# Patient Record
Sex: Female | Born: 1960 | Race: Black or African American | Hispanic: No | Marital: Married | Smoking: Never smoker
Health system: Southern US, Community
[De-identification: ages and names within clinical notes are randomized; demographics above are authoritative.]

## PROBLEM LIST (undated history)

## (undated) DIAGNOSIS — G629 Polyneuropathy, unspecified: Secondary | ICD-10-CM

## (undated) DIAGNOSIS — I10 Essential (primary) hypertension: Secondary | ICD-10-CM

## (undated) DIAGNOSIS — I82409 Acute embolism and thrombosis of unspecified deep veins of unspecified lower extremity: Secondary | ICD-10-CM

## (undated) DIAGNOSIS — E119 Type 2 diabetes mellitus without complications: Secondary | ICD-10-CM

## (undated) DIAGNOSIS — B1920 Unspecified viral hepatitis C without hepatic coma: Secondary | ICD-10-CM

## (undated) DIAGNOSIS — M1990 Unspecified osteoarthritis, unspecified site: Secondary | ICD-10-CM

## (undated) DIAGNOSIS — G43909 Migraine, unspecified, not intractable, without status migrainosus: Secondary | ICD-10-CM

## (undated) HISTORY — PX: ABDOMINAL HYSTERECTOMY: SHX81

## (undated) HISTORY — DX: Migraine, unspecified, not intractable, without status migrainosus: G43.909

## (undated) HISTORY — DX: Acute embolism and thrombosis of unspecified deep veins of unspecified lower extremity: I82.409

---

## 2005-01-13 ENCOUNTER — Emergency Department: Payer: Self-pay | Admitting: Emergency Medicine

## 2005-06-11 ENCOUNTER — Ambulatory Visit: Payer: Self-pay

## 2005-08-11 ENCOUNTER — Emergency Department: Payer: Self-pay | Admitting: Emergency Medicine

## 2006-03-02 ENCOUNTER — Emergency Department: Payer: Self-pay | Admitting: Emergency Medicine

## 2006-09-15 ENCOUNTER — Emergency Department: Payer: Self-pay | Admitting: Emergency Medicine

## 2008-05-18 ENCOUNTER — Emergency Department: Payer: Self-pay | Admitting: Unknown Physician Specialty

## 2008-05-23 ENCOUNTER — Emergency Department: Payer: Self-pay | Admitting: Emergency Medicine

## 2008-06-12 ENCOUNTER — Emergency Department: Payer: Self-pay | Admitting: Emergency Medicine

## 2008-07-24 ENCOUNTER — Emergency Department: Payer: Self-pay | Admitting: Emergency Medicine

## 2009-04-25 ENCOUNTER — Emergency Department (HOSPITAL_COMMUNITY): Admission: EM | Admit: 2009-04-25 | Discharge: 2009-04-25 | Payer: Self-pay | Admitting: Emergency Medicine

## 2009-10-08 ENCOUNTER — Emergency Department: Payer: Self-pay | Admitting: Emergency Medicine

## 2011-01-20 LAB — GLUCOSE, CAPILLARY: Glucose-Capillary: 105 mg/dL — ABNORMAL HIGH (ref 70–99)

## 2011-02-07 ENCOUNTER — Ambulatory Visit: Payer: Self-pay

## 2011-02-14 ENCOUNTER — Ambulatory Visit: Payer: Self-pay

## 2012-01-29 ENCOUNTER — Emergency Department: Payer: Self-pay | Admitting: Emergency Medicine

## 2012-01-30 LAB — BASIC METABOLIC PANEL
BUN: 18 mg/dL (ref 7–18)
Calcium, Total: 8.7 mg/dL (ref 8.5–10.1)
Creatinine: 1.02 mg/dL (ref 0.60–1.30)
EGFR (African American): >60
Glucose: 145 mg/dL — ABNORMAL HIGH (ref 65–99)
Osmolality: 284 (ref 275–301)
Potassium: 3.2 mmol/L — ABNORMAL LOW (ref 3.5–5.1)
Sodium: 140 mmol/L (ref 136–145)

## 2012-01-30 LAB — CBC
HGB: 12.4 g/dL (ref 12.0–16.0)
MCH: 30.2 pg (ref 26.0–34.0)
RBC: 4.11 10*6/uL (ref 3.80–5.20)
RDW: 13.5 % (ref 11.5–14.5)

## 2012-01-30 LAB — TROPONIN I: Troponin-I: 0.03 ng/mL

## 2012-10-28 ENCOUNTER — Emergency Department: Payer: Self-pay | Admitting: Emergency Medicine

## 2012-10-28 LAB — COMPREHENSIVE METABOLIC PANEL
Albumin: 4.1 g/dL (ref 3.4–5.0)
Anion Gap: 7 (ref 7–16)
BUN: 12 mg/dL (ref 7–18)
Chloride: 106 mmol/L (ref 98–107)
Co2: 26 mmol/L (ref 21–32)
EGFR (Non-African Amer.): >60
Glucose: 89 mg/dL (ref 65–99)
SGOT(AST): 51 U/L — ABNORMAL HIGH (ref 15–37)
SGPT (ALT): 92 U/L — ABNORMAL HIGH (ref 12–78)

## 2012-10-28 LAB — CBC
HCT: 41.1 % (ref 35.0–47.0)
MCH: 30.7 pg (ref 26.0–34.0)

## 2012-10-28 LAB — CK TOTAL AND CKMB (NOT AT ARMC)
CK, Total: 152 U/L (ref 21–215)
CK-MB: 1.2 ng/mL (ref 0.5–3.6)

## 2013-03-22 ENCOUNTER — Emergency Department: Payer: Self-pay | Admitting: Emergency Medicine

## 2013-12-29 LAB — HM DIABETES EYE EXAM

## 2014-07-10 ENCOUNTER — Ambulatory Visit: Payer: Self-pay

## 2014-12-26 ENCOUNTER — Ambulatory Visit: Payer: Self-pay | Admitting: Internal Medicine

## 2015-02-15 ENCOUNTER — Other Ambulatory Visit: Payer: Self-pay

## 2015-03-28 ENCOUNTER — Other Ambulatory Visit: Payer: Self-pay

## 2015-04-04 ENCOUNTER — Ambulatory Visit: Payer: Self-pay | Admitting: Internal Medicine

## 2015-05-15 ENCOUNTER — Other Ambulatory Visit: Payer: Self-pay

## 2015-06-13 ENCOUNTER — Ambulatory Visit: Payer: Self-pay | Admitting: Internal Medicine

## 2015-07-03 ENCOUNTER — Ambulatory Visit: Payer: Self-pay

## 2015-07-03 LAB — MICROALBUMIN, URINE: Microalb, Ur: 3.2

## 2015-07-17 ENCOUNTER — Ambulatory Visit: Payer: Self-pay

## 2015-07-18 ENCOUNTER — Ambulatory Visit: Payer: Self-pay | Admitting: Internal Medicine

## 2015-07-26 ENCOUNTER — Other Ambulatory Visit: Payer: Self-pay

## 2015-07-26 ENCOUNTER — Encounter: Payer: Self-pay | Admitting: *Deleted

## 2015-07-26 ENCOUNTER — Emergency Department
Admission: EM | Admit: 2015-07-26 | Discharge: 2015-07-26 | Disposition: A | Discharge status: Home / Self Care | Payer: Medicaid Other | Attending: Emergency Medicine | Admitting: Emergency Medicine

## 2015-07-26 DIAGNOSIS — E1165 Type 2 diabetes mellitus with hyperglycemia: Secondary | ICD-10-CM | POA: Insufficient documentation

## 2015-07-26 DIAGNOSIS — Z791 Long term (current) use of non-steroidal anti-inflammatories (NSAID): Secondary | ICD-10-CM | POA: Insufficient documentation

## 2015-07-26 DIAGNOSIS — R739 Hyperglycemia, unspecified: Secondary | ICD-10-CM

## 2015-07-26 DIAGNOSIS — Z79899 Other long term (current) drug therapy: Secondary | ICD-10-CM | POA: Insufficient documentation

## 2015-07-26 HISTORY — DX: Type 2 diabetes mellitus without complications: E11.9

## 2015-07-26 HISTORY — DX: Unspecified osteoarthritis, unspecified site: M19.90

## 2015-07-26 HISTORY — DX: Polyneuropathy, unspecified: G62.9

## 2015-07-26 HISTORY — DX: Unspecified viral hepatitis C without hepatic coma: B19.20

## 2015-07-26 LAB — CBC
HCT: 41.9 % (ref 35.0–47.0)
Hemoglobin: 14.2 g/dL (ref 12.0–16.0)
MCH: 30 pg (ref 26.0–34.0)
MCHC: 34 g/dL (ref 32.0–36.0)
MCV: 88.4 fL (ref 80.0–100.0)
PLATELETS: 159 10*3/uL (ref 150–440)
RBC: 4.74 MIL/uL (ref 3.80–5.20)
RDW: 12.7 % (ref 11.5–14.5)
WBC: 8.3 10*3/uL (ref 3.6–11.0)

## 2015-07-26 LAB — BASIC METABOLIC PANEL
ANION GAP: 8 (ref 5–15)
BUN: 14 mg/dL (ref 6–20)
CALCIUM: 9.8 mg/dL (ref 8.9–10.3)
CO2: 24 mmol/L (ref 22–32)
CREATININE: 0.93 mg/dL (ref 0.44–1.00)
Chloride: 101 mmol/L (ref 101–111)
Glucose, Bld: 492 mg/dL — ABNORMAL HIGH (ref 65–99)
Potassium: 4 mmol/L (ref 3.5–5.1)
SODIUM: 133 mmol/L — AB (ref 135–145)

## 2015-07-26 LAB — URINALYSIS COMPLETE WITH MICROSCOPIC (ARMC ONLY)
BILIRUBIN URINE: NEGATIVE
Bacteria, UA: NONE SEEN
Glucose, UA: >500 mg/dL — AB
Hgb urine dipstick: NEGATIVE
Leukocytes, UA: NEGATIVE
NITRITE: NEGATIVE
PH: 5 (ref 5.0–8.0)
Protein, ur: NEGATIVE mg/dL
SPECIFIC GRAVITY, URINE: 1.031 — AB (ref 1.005–1.030)

## 2015-07-26 LAB — GLUCOSE, CAPILLARY
GLUCOSE-CAPILLARY: 362 mg/dL — AB (ref 65–99)
GLUCOSE-CAPILLARY: 279 mg/dL — AB (ref 65–99)
Glucose-Capillary: 451 mg/dL — ABNORMAL HIGH (ref 65–99)
Glucose-Capillary: 349 mg/dL — ABNORMAL HIGH (ref 65–99)

## 2015-07-26 MED ORDER — SODIUM CHLORIDE 0.9 % IV BOLUS (SEPSIS): 1000.0000 mL | Freq: Once | INTRAVENOUS | Status: DC

## 2015-07-26 MED ORDER — SODIUM CHLORIDE 0.9 % IV BOLUS (SEPSIS)
1000.0000 mL | Freq: Once | INTRAVENOUS | Status: AC
  Administered 2015-07-26: 1000 mL via INTRAVENOUS

## 2015-07-26 NOTE — ED Notes (Signed)
Patient was sent to ED by [facility] after CBG was 468, pt reports went to clinic after having weakness, fatigue and blurry vision symptoms. Pt reports weakness and fatigue since 07/22/15, reports bilateral feet and finger numbness and increased urinary frequency today. Patient alert and oriented x 4, respirations even and unlabored, skin warm and dry. Family at bedside, call bell within reach.

## 2015-07-26 NOTE — ED Notes (Signed)
Patient tolerating drinking diet ginger ale and saltine crackers, denies nausea. Reports "I am feeling a lot better." MD notified of patient status.

## 2015-07-26 NOTE — ED Provider Notes (Signed)
[facility] Emergency Department Provider Note  ____________________________________________   I have reviewed the triage vital signs and the nursing notes.   HISTORY  Chief Complaint Hyperglycemia    HPI Dana Esparza is a 54 y.o. female With a history of diabetes of long-standing, who is on Glucotrol 10 mg tablets, has not taken her Glucotrol yet. Normally takes it in the evening. Thinks she took it last night at 4:30 in the morning but is not 100% sure. Has missed at other times in the recent past. Has not run out of the medication and hasn't with her. She presents because her sugars are high. She states she ate "a big breakfast" with biscuits and gravy and many other high caloric items. After that her sugar has been high. She does not have any chest pain shortness of breath nausea vomiting diarrhea or abdominal pain. She has been urinating somewhat more freely but there is no dysuria or urinary hesitancy. The patient has had no hematuria. She denies any other symptoms at this time she states "I feel pretty good I feel my sugar is coming down".  Past Medical History  Diagnosis Date  . Diabetes mellitus without complication (HCC)   . Hepatitis C   . Arthritis   . Neuropathy (HCC)     There are no active problems to display for this patient.   Past Surgical History  Procedure Laterality Date  . Abdominal hysterectomy      Current Outpatient Rx  Name  Route  Sig  Dispense  Refill  . docusate sodium (COLACE) 100 MG capsule   Oral   Take 100 mg by mouth 2 (two) times daily as needed.         . gabapentin (NEURONTIN) 300 MG capsule   Oral   Take 300 mg by mouth 3 (three) times daily.         Marland Kitchen glipiZIDE (GLUCOTROL XL) 10 MG 24 hr tablet   Oral   Take 10 mg by mouth daily.         . hydrOXYzine (ATARAX/VISTARIL) 25 MG tablet   Oral   Take 50 mg by mouth 2 (two) times daily as needed.         . naproxen (NAPROSYN) 250 MG tablet  Oral   Take 500 mg by mouth 2 (two) times daily.         Marland Kitchen omeprazole (PRILOSEC) 10 MG capsule   Oral   Take 20 mg by mouth daily.           Allergies Percocet  No family history on file.  Social History Social History  Substance Use Topics  . Smoking status: Never Smoker   . Smokeless tobacco: None  . Alcohol Use: No    Review of Systems Constitutional: No fever/chills Eyes: No visual changes. ENT: No sore throat. No stiff neck no neck pain Cardiovascular: Denies chest pain. Respiratory: Denies shortness of breath. Gastrointestinal:   no vomiting.  No diarrhea.  No constipation. Genitourinary: Negative for dysuria. Musculoskeletal: Negative lower extremity swelling Skin: Negative for rash. Neurological: Negative for headaches, focal weakness or numbness. 10-point ROS otherwise negative.  ____________________________________________   PHYSICAL EXAM:  VITAL SIGNS: ED Triage Vitals  Enc Vitals Group     BP 07/26/15 1719 131/87 mmHg     Pulse Rate 07/26/15 1719 90     Resp 07/26/15 1719 18     Temp 07/26/15 1719 98.8 F (37.1 C)     Temp Source 07/26/15 1719  Oral     SpO2 07/26/15 1719 97 %     Weight 07/26/15 1719 173 lb (78.472 kg)     Height 07/26/15 1719 5\' 6"  (1.676 m)     Head Cir --      Peak Flow --      Pain Score 07/26/15 1719 9     Pain Loc --      Pain Edu? --      Excl. in GC? --     Constitutional: Alert and oriented. Well appearing and in no acute distress. Eyes: Conjunctivae are normal. PERRL. EOMI. Head: Atraumatic. Nose: No congestion/rhinnorhea. Mouth/Throat: Mucous membranes are moist.  Oropharynx non-erythematous. Neck: No stridor.   Nontender with no meningismus Cardiovascular: Normal rate, regular rhythm. Grossly normal heart sounds.  Good peripheral circulation. Respiratory: Normal respiratory effort.  No retractions. Lungs CTAB. Gastrointestinal: Soft and nontender. No distention. No guarding no rebound Back:  There is no  focal tenderness or step off there is no midline tenderness there are no lesions noted. there is no CVA tenderness Musculoskeletal: No lower extremity tenderness. No joint effusions, no DVT signs strong distal pulses no edema Neurologic:  Normal speech and language. No gross focal neurologic deficits are appreciated.  Skin:  Skin is warm, dry and intact. No rash noted. Psychiatric: Mood and affect are normal. Speech and behavior are normal.  ____________________________________________   LABS (all labs ordered are listed, but only abnormal results are displayed)  Labs Reviewed  BASIC METABOLIC PANEL - Abnormal; Notable for the following:    Sodium 133 (*)    Glucose, Bld 492 (*)    All other components within normal limits  URINALYSIS COMPLETEWITH MICROSCOPIC ([facility] ONLY) - Abnormal; Notable for the following:    Color, Urine STRAW (*)    APPearance CLEAR (*)    Glucose, UA >500 (*)    Ketones, ur 1+ (*)    Specific Gravity, Urine 1.031 (*)    Squamous Epithelial / LPF 0-5 (*)    All other components within normal limits  GLUCOSE, CAPILLARY - Abnormal; Notable for the following:    Glucose-Capillary 451 (*)    All other components within normal limits  GLUCOSE, CAPILLARY - Abnormal; Notable for the following:    Glucose-Capillary 349 (*)    All other components within normal limits  CBC  CBG MONITORING, ED   ____________________________________________  EKG   ____________________________________________  RADIOLOGY   ____________________________________________   PROCEDURES  Procedure(s) performed: None  Critical Care performed: None  ____________________________________________   INITIAL IMPRESSION / ASSESSMENT AND PLAN / ED COURSE  Pertinent labs & imaging results that were available during my care of the patient were reviewed by me and considered in my medical decision making (see chart for details).  Markedly well-appearing woman who has had elevated  blood sugar today no evidence of DKA no evidence of acute infection no evidence of severe dehydration we'll give her IV fluid however she is taking her home glipizide and I have advised to be very careful about taking medication as prescribed and not missing doses. Also we have discussed extensively diabetic diet. Patient already has diabetic neuropathy. She does not know her hemoglobin A1c is. She is quite well-appearing and eager to go home. We will rehydrate her as I feel that she is somewhat dehydrated and we will discharge her at less other symptoms intervene. ____________________________________________   FINAL CLINICAL IMPRESSION(S) / ED DIAGNOSES  Final diagnoses:  None     Jeanmarie PlantJames A McShane, MD  07/26/15 2059 

## 2015-07-26 NOTE — ED Notes (Signed)
Pt reports her sugar level today was in the 400's. Has felt weak, dizzy, tired, urinary frequency, & blurry vision.

## 2015-08-02 ENCOUNTER — Ambulatory Visit: Payer: Self-pay

## 2015-09-04 ENCOUNTER — Ambulatory Visit: Payer: Self-pay

## 2015-09-12 ENCOUNTER — Other Ambulatory Visit: Payer: Self-pay

## 2015-09-12 LAB — BASIC METABOLIC PANEL
BUN: 10 mg/dL (ref 4–21)
Creatinine: 0.8 mg/dL (ref 0.5–1.1)
GLUCOSE: 436 mg/dL
Potassium: 4 mmol/L (ref 3.4–5.3)
SODIUM: 141 mmol/L (ref 137–147)

## 2015-09-12 LAB — CBC AND DIFFERENTIAL
HEMATOCRIT: 40 % (ref 36–46)
HEMOGLOBIN: 13.4 g/dL (ref 12.0–16.0)
NEUTROS ABS: 3 /uL
Platelets: 192 10*3/uL (ref 150–399)
WBC: 5.5 10^3/mL

## 2015-09-12 LAB — HEPATIC FUNCTION PANEL
ALT: 40 U/L — AB (ref 7–35)
AST: 32 U/L (ref 13–35)
Alkaline Phosphatase: 145 U/L — AB (ref 25–125)
BILIRUBIN, TOTAL: 0.3 mg/dL

## 2015-09-13 ENCOUNTER — Ambulatory Visit: Payer: Self-pay

## 2015-09-18 ENCOUNTER — Ambulatory Visit: Payer: Self-pay

## 2015-09-19 ENCOUNTER — Ambulatory Visit: Payer: Self-pay | Admitting: Internal Medicine

## 2015-09-27 ENCOUNTER — Ambulatory Visit: Payer: Self-pay

## 2015-10-02 ENCOUNTER — Institutional Professional Consult (permissible substitution): Payer: Self-pay

## 2015-10-09 ENCOUNTER — Ambulatory Visit: Payer: Self-pay

## 2015-10-10 ENCOUNTER — Ambulatory Visit: Payer: Self-pay | Admitting: Internal Medicine

## 2015-10-11 ENCOUNTER — Emergency Department
Admission: EM | Admit: 2015-10-11 | Discharge: 2015-10-11 | Disposition: A | Discharge status: Home / Self Care | Payer: Medicaid Other | Attending: Emergency Medicine | Admitting: Emergency Medicine

## 2015-10-11 ENCOUNTER — Encounter: Payer: Self-pay | Admitting: Emergency Medicine

## 2015-10-11 DIAGNOSIS — Z79899 Other long term (current) drug therapy: Secondary | ICD-10-CM | POA: Insufficient documentation

## 2015-10-11 DIAGNOSIS — E109 Type 1 diabetes mellitus without complications: Secondary | ICD-10-CM | POA: Insufficient documentation

## 2015-10-11 DIAGNOSIS — L509 Urticaria, unspecified: Secondary | ICD-10-CM | POA: Diagnosis present

## 2015-10-11 DIAGNOSIS — Z791 Long term (current) use of non-steroidal anti-inflammatories (NSAID): Secondary | ICD-10-CM | POA: Insufficient documentation

## 2015-10-11 DIAGNOSIS — Z9119 Patient's noncompliance with other medical treatment and regimen: Secondary | ICD-10-CM | POA: Diagnosis not present

## 2015-10-11 DIAGNOSIS — Z9114 Patient's other noncompliance with medication regimen: Secondary | ICD-10-CM

## 2015-10-11 DIAGNOSIS — E1065 Type 1 diabetes mellitus with hyperglycemia: Secondary | ICD-10-CM

## 2015-10-11 LAB — GLUCOSE, CAPILLARY
GLUCOSE-CAPILLARY: 378 mg/dL — AB (ref 65–99)
Glucose-Capillary: 289 mg/dL — ABNORMAL HIGH (ref 65–99)
Glucose-Capillary: 263 mg/dL — ABNORMAL HIGH (ref 65–99)

## 2015-10-11 MED ORDER — DIPHENHYDRAMINE HCL 50 MG/ML IJ SOLN
25.0000 mg | Freq: Once | INTRAMUSCULAR | Status: AC
  Administered 2015-10-11: 25 mg via INTRAVENOUS
  Filled 2015-10-11: qty 1

## 2015-10-11 MED ORDER — LORATADINE 10 MG PO TABS
10.0000 mg | ORAL_TABLET | Freq: Every day | ORAL | Status: AC
Start: 2015-10-11 — End: 2016-10-10

## 2015-10-11 MED ORDER — FAMOTIDINE IN NACL 20-0.9 MG/50ML-% IV SOLN
20.0000 mg | Freq: Once | INTRAVENOUS | Status: AC
  Administered 2015-10-11: 20 mg via INTRAVENOUS
  Filled 2015-10-11: qty 50

## 2015-10-11 MED ORDER — RANITIDINE HCL 150 MG PO TABS: 150.0000 mg | ORAL_TABLET | Freq: Two times a day (BID) | ORAL | Status: AC

## 2015-10-11 MED ORDER — INSULIN ASPART 100 UNIT/ML ~~LOC~~ SOLN
10.0000 [IU] | Freq: Once | SUBCUTANEOUS | Status: AC
  Administered 2015-10-11: 10 [IU] via INTRAVENOUS
  Filled 2015-10-11: qty 10

## 2015-10-11 NOTE — ED Notes (Signed)
Pt CBG 348; pt reports she has not taken insulin this morning. PA notified.

## 2015-10-11 NOTE — ED Notes (Signed)
Pt in w/ hives to entire body; states this has been going on for about a month and the hives do not go away.  Pt reports being seen at [facility] and MD stating this has to do w/ her uncontrolled diabetes.  Pt tongue swollen, reports difficulty swallowing.  Pt able to speak clearly at this time.  Pt in no immediate distress.

## 2015-10-11 NOTE — Discharge Instructions (Signed)
Begin taking Zantac and Claritin to help control your hives. Check blood sugar daily and also keep to a diabetic diet. Take medications daily as you have been prescribed. [facility]up with [facility] for further evaluation of your blood sugar and hives.

## 2015-10-11 NOTE — ED Provider Notes (Signed)
[facility] Emergency Department Provider Note  ____________________________________________  Time seen: Approximately 12:11 PM  I have reviewed the triage vital signs and the nursing notes.   HISTORY  Chief Complaint Urticaria  HPI Dana Esparza is a 54 y.o. female is here with complaint of hives "all over". Patient states this been going on for about a month. She states they come and go and she was seen at the [facility] where the doctor there told her it was due to her uncontrolled diabetes. Patient states that this morning when she woke up her tongue was swollen and she had difficulty swallowing. Currently she is not having any difficulty swallowing and speaking without any difficulty. She states that she took Benadryl once yesterday and has not taken anything today. When questioning her about her diabetes she states that she does not check her blood sugars at all. She currently takes insulin but later did disclose that she has not taken her last 2 doses of insulin. She also does not adhere to a diabetic diet. She denies any difficulty breathing or swallowing at this time.   Past Medical History  Diagnosis Date  . Diabetes mellitus without complication (HCC)   . Hepatitis C   . Arthritis   . Neuropathy (HCC)     There are no active problems to display for this patient.   Past Surgical History  Procedure Laterality Date  . Abdominal hysterectomy      Current Outpatient Rx  Name  Route  Sig  Dispense  Refill  . docusate sodium (COLACE) 100 MG capsule   Oral   Take 100 mg by mouth 2 (two) times daily as needed.         . gabapentin (NEURONTIN) 300 MG capsule   Oral   Take 300 mg by mouth 3 (three) times daily.         Marland Kitchen glipiZIDE (GLUCOTROL XL) 10 MG 24 hr tablet   Oral   Take 10 mg by mouth daily.         . hydrOXYzine (ATARAX/VISTARIL) 25 MG tablet   Oral   Take 50 mg by mouth 2 (two) times daily as needed.         .  loratadine (CLARITIN) 10 MG tablet   Oral   Take 1 tablet (10 mg total) by mouth daily.   30 tablet   2   . naproxen (NAPROSYN) 250 MG tablet   Oral   Take 500 mg by mouth 2 (two) times daily.         Marland Kitchen omeprazole (PRILOSEC) 10 MG capsule   Oral   Take 20 mg by mouth daily.         . ranitidine (ZANTAC) 150 MG tablet   Oral   Take 1 tablet (150 mg total) by mouth 2 (two) times daily.   60 tablet   1     Allergies Orange fruit and Percocet  No family history on file.  Social History Social History  Substance Use Topics  . Smoking status: Never Smoker   . Smokeless tobacco: None  . Alcohol Use: Yes     Comment: 12oz every other day    Review of Systems Constitutional: No fever/chills Eyes: No visual changes. ENT: No sore throat. "Swollen tongue". Cardiovascular: Denies chest pain. Respiratory: Denies shortness of breath. Gastrointestinal:   No nausea, no vomiting.  No diarrhea.  No constipation. Genitourinary: Negative for dysuria. Musculoskeletal: Negative for back pain. Skin: Positive for rash  Neurological: Negative for headaches, focal weakness or numbness.  10-point ROS otherwise negative.  ____________________________________________   PHYSICAL EXAM:  VITAL SIGNS: ED Triage Vitals  Enc Vitals Group     BP 10/11/15 1134 125/99 mmHg     Pulse Rate 10/11/15 1134 82     Resp 10/11/15 1134 20     Temp 10/11/15 1134 98.4 F (36.9 C)     Temp Source 10/11/15 1134 Oral     SpO2 10/11/15 1134 99 %     Weight 10/11/15 1134 155 lb (70.308 kg)     Height 10/11/15 1134 5\' 6"  (1.676 m)     Head Cir --      Peak Flow --      Pain Score --      Pain Loc --      Pain Edu? --      Excl. in GC? --     Constitutional: Alert and oriented. Well appearing and in no acute distress. Eyes: Conjunctivae are normal. PERRL. EOMI. Head: Atraumatic. Nose: No congestion/rhinnorhea. Mouth/Throat: Mucous membranes are moist.  Oropharynx non-erythematous. Tongue is  non-edematous. No difficulties with swallowing is noted. Neck: No stridor.  Supple Hematological/Lymphatic/Immunilogical: No cervical lymphadenopathy. Cardiovascular: Normal rate, regular rhythm. Grossly normal heart sounds.  Good peripheral circulation. Respiratory: Normal respiratory effort.  No retractions. Lungs CTAB. Gastrointestinal: Soft and nontender. No distention. Musculoskeletal: No lower extremity tenderness nor edema.  No joint effusions. Neurologic:  Normal speech and language. No gross focal neurologic deficits are appreciated. No gait instability. Skin:  Skin is warm, dry and intact. Irregular, macular, erythematous lesions diffusely over lower extremities, trunk and right forearm. Psychiatric: Mood and affect are normal. Speech and behavior are normal.  ____________________________________________   LABS (all labs ordered are listed, but only abnormal results are displayed)  Labs Reviewed  GLUCOSE, CAPILLARY - Abnormal; Notable for the following:    Glucose-Capillary 378 (*)    All other components within normal limits  GLUCOSE, CAPILLARY - Abnormal; Notable for the following:    Glucose-Capillary 289 (*)    All other components within normal limits  GLUCOSE, CAPILLARY - Abnormal; Notable for the following:    Glucose-Capillary 263 (*)    All other components within normal limits  CBG MONITORING, ED  CBG MONITORING, ED     PROCEDURES  Procedure(s) performed: None  Critical Care performed: No  ____________________________________________   INITIAL IMPRESSION / ASSESSMENT AND PLAN / ED COURSE  Pertinent labs & imaging results that were available during my care of the patient were reviewed by me and considered in my medical decision making (see chart for details).  Patient is to [facility] with [facility]. Patient was prescribed Zantac twice a day and Claritin daily. She is to adhere to a diabetic diet and take insulin as prescribed by [facility] doctor at  [facility]. She is told to return to the emergency room today severe worsening of her symptoms such as difficulty breathing or swelling of the tongue. ____________________________________________   FINAL CLINICAL IMPRESSION(S) / ED DIAGNOSES  Final diagnoses:  Urticaria  Poorly controlled type 1 diabetes mellitus (HCC)  Noncompliance with medications      Tommi RumpsRhonda L Yuli Lanigan, PA-C 10/11/15 1604  Sharman CheekPhillip Stafford, MD 10/12/15 [phone]

## 2015-10-24 ENCOUNTER — Ambulatory Visit: Payer: Self-pay | Admitting: Internal Medicine

## 2015-11-20 ENCOUNTER — Ambulatory Visit: Payer: Self-pay

## 2015-11-20 DIAGNOSIS — B1920 Unspecified viral hepatitis C without hepatic coma: Secondary | ICD-10-CM | POA: Insufficient documentation

## 2015-11-21 ENCOUNTER — Ambulatory Visit: Payer: Self-pay | Admitting: Internal Medicine

## 2015-11-22 ENCOUNTER — Other Ambulatory Visit: Payer: Self-pay

## 2015-11-27 ENCOUNTER — Ambulatory Visit: Payer: Self-pay

## 2015-12-04 ENCOUNTER — Institutional Professional Consult (permissible substitution): Payer: Self-pay

## 2015-12-04 DIAGNOSIS — E119 Type 2 diabetes mellitus without complications: Secondary | ICD-10-CM | POA: Insufficient documentation

## 2015-12-04 LAB — HEMOGLOBIN A1C: HEMOGLOBIN A1C: 13.6

## 2015-12-21 ENCOUNTER — Encounter: Payer: Self-pay | Admitting: Emergency Medicine

## 2015-12-21 ENCOUNTER — Emergency Department
Admission: EM | Admit: 2015-12-21 | Discharge: 2015-12-21 | Disposition: A | Discharge status: Home / Self Care | Payer: Medicaid Other | Attending: Emergency Medicine | Admitting: Emergency Medicine

## 2015-12-21 DIAGNOSIS — S43421A Sprain of right rotator cuff capsule, initial encounter: Secondary | ICD-10-CM | POA: Diagnosis not present

## 2015-12-21 DIAGNOSIS — I10 Essential (primary) hypertension: Secondary | ICD-10-CM | POA: Diagnosis not present

## 2015-12-21 DIAGNOSIS — S46011A Strain of muscle(s) and tendon(s) of the rotator cuff of right shoulder, initial encounter: Secondary | ICD-10-CM

## 2015-12-21 DIAGNOSIS — X58XXXA Exposure to other specified factors, initial encounter: Secondary | ICD-10-CM | POA: Diagnosis not present

## 2015-12-21 DIAGNOSIS — Z791 Long term (current) use of non-steroidal anti-inflammatories (NSAID): Secondary | ICD-10-CM | POA: Insufficient documentation

## 2015-12-21 DIAGNOSIS — Z79899 Other long term (current) drug therapy: Secondary | ICD-10-CM | POA: Insufficient documentation

## 2015-12-21 DIAGNOSIS — Z7984 Long term (current) use of oral hypoglycemic drugs: Secondary | ICD-10-CM | POA: Diagnosis not present

## 2015-12-21 DIAGNOSIS — Y998 Other external cause status: Secondary | ICD-10-CM | POA: Diagnosis not present

## 2015-12-21 DIAGNOSIS — Y92009 Unspecified place in unspecified non-institutional (private) residence as the place of occurrence of the external cause: Secondary | ICD-10-CM | POA: Insufficient documentation

## 2015-12-21 DIAGNOSIS — H748X3 Other specified disorders of middle ear and mastoid, bilateral: Secondary | ICD-10-CM | POA: Insufficient documentation

## 2015-12-21 DIAGNOSIS — S46012A Strain of muscle(s) and tendon(s) of the rotator cuff of left shoulder, initial encounter: Secondary | ICD-10-CM | POA: Diagnosis not present

## 2015-12-21 DIAGNOSIS — S4992XA Unspecified injury of left shoulder and upper arm, initial encounter: Secondary | ICD-10-CM | POA: Diagnosis present

## 2015-12-21 DIAGNOSIS — E1140 Type 2 diabetes mellitus with diabetic neuropathy, unspecified: Secondary | ICD-10-CM | POA: Diagnosis not present

## 2015-12-21 DIAGNOSIS — S43422A Sprain of left rotator cuff capsule, initial encounter: Secondary | ICD-10-CM | POA: Diagnosis not present

## 2015-12-21 DIAGNOSIS — Y93E2 Activity, laundry: Secondary | ICD-10-CM | POA: Diagnosis not present

## 2015-12-21 HISTORY — DX: Essential (primary) hypertension: I10

## 2015-12-21 MED ORDER — CYCLOBENZAPRINE HCL 5 MG PO TABS: 5.0000 mg | ORAL_TABLET | Freq: Three times a day (TID) | ORAL | Status: DC | PRN

## 2015-12-21 NOTE — ED Provider Notes (Signed)
[facility] Emergency Department Provider Note ____________________________________________  Time seen: 1143  I have reviewed the triage vital signs and the nursing notes.  HISTORY  Chief Complaint  Otalgia and Generalized Body Aches  HPI Dana Esparza is a 55 y.o. female presents to the ED with bilateral nares for last 3 days. She also describes unrelated complaint, bilateral upper arm and shoulder pain after increased activity at home. She has a history of diabetic foot neuropathy and notes that that is also at times flare. She describes pain to the anterior and upper shoulders, that seems to be aggravated following a day of several loads of laundry. She reports that she's had [facility], for rotator cuff tendinitis. She has not had one since almost a year secondary to loss of insurance benefits. She denies any interim injury, trauma, or accident. She has been dosing her prescription Naprosyn for pain relief but notes limited benefit. She rates her pain overall a 9/10 in triage.  Past Medical History  Diagnosis Date  . Diabetes mellitus without complication (HCC)   . Hepatitis C   . Arthritis   . Neuropathy (HCC)   . Hypertension     There are no active problems to display for this patient.   Past Surgical History  Procedure Laterality Date  . Abdominal hysterectomy      Current Outpatient Rx  Name  Route  Sig  Dispense  Refill  . cyclobenzaprine (FLEXERIL) 5 MG tablet   Oral   Take 1 tablet (5 mg total) by mouth every 8 (eight) hours as needed for muscle spasms.   12 tablet   0   . docusate sodium (COLACE) 100 MG capsule   Oral   Take 100 mg by mouth 2 (two) times daily as needed.         . gabapentin (NEURONTIN) 300 MG capsule   Oral   Take 300 mg by mouth 3 (three) times daily.         Marland Kitchen. glipiZIDE (GLUCOTROL XL) 10 MG 24 hr tablet   Oral   Take 10 mg by mouth daily.         . hydrOXYzine (ATARAX/VISTARIL) 25  MG tablet   Oral   Take 50 mg by mouth 2 (two) times daily as needed.         . loratadine (CLARITIN) 10 MG tablet   Oral   Take 1 tablet (10 mg total) by mouth daily.   30 tablet   2   . naproxen (NAPROSYN) 250 MG tablet   Oral   Take 500 mg by mouth 2 (two) times daily.         Marland Kitchen. omeprazole (PRILOSEC) 10 MG capsule   Oral   Take 20 mg by mouth daily.         . ranitidine (ZANTAC) 150 MG tablet   Oral   Take 1 tablet (150 mg total) by mouth 2 (two) times daily.   60 tablet   1    Allergies Orange fruit and Percocet  History reviewed. No pertinent family history.  Social History Social History  Substance Use Topics  . Smoking status: Never Smoker   . Smokeless tobacco: None  . Alcohol Use: Yes     Comment: 12oz every other day   Review of Systems  Constitutional: Negative for fever. Eyes: Negative for visual changes. ENT: Negative for sore throat. Cardiovascular: Negative for chest pain. Respiratory: Negative for shortness of breath. Gastrointestinal: Negative for abdominal  pain, vomiting and diarrhea. Genitourinary: Negative for dysuria. Musculoskeletal: Negative for back pain. Bilateral shoulder pain as above. Skin: Negative for rash. Neurological: Negative for headaches, focal weakness or numbness. Baseline foot neuropathy. ____________________________________________  PHYSICAL EXAM:  VITAL SIGNS: ED Triage Vitals  Enc Vitals Group     BP 12/21/15 1100 153/99 mmHg     Pulse Rate 12/21/15 1100 82     Resp 12/21/15 1100 20     Temp 12/21/15 1100 98.2 F (36.8 C)     Temp Source 12/21/15 1100 Oral     SpO2 12/21/15 1100 100 %     Weight 12/21/15 1100 155 lb (70.308 kg)     Height --      Head Cir --      Peak Flow --      Pain Score 12/21/15 1101 9     Pain Loc --      Pain Edu? --      Excl. in GC? --    Constitutional: Alert and oriented. Well appearing and in no distress. Head: Normocephalic and atraumatic.      Eyes: Conjunctivae  are normal. PERRL. Normal extraocular movements      Ears: Canals clear. TMs intact bilaterally. Serous effusion noted bilaterally.   Nose: No congestion/rhinorrhea.   Mouth/Throat: Mucous membranes are moist.   Neck: Supple. No thyromegaly. Hematological/Lymphatic/Immunological: No cervical lymphadenopathy. Cardiovascular: Normal rate, regular rhythm.  Respiratory: Normal respiratory effort. No wheezes/rales/rhonchi. Gastrointestinal: Soft and nontender. No distention. Musculoskeletal: Patient with decreased active range of motion with full extension in the arms above 100. She is noted to have solid rotator cuff resistance testing bilaterally. Normal composite fist bilaterally. Nontender with normal range of motion in all extremities.  Neurologic: Cranial nerves II through XII grossly intact. Normal UE DTRs bilaterally. Normal gait without ataxia. Normal speech and language. No gross focal neurologic deficits are appreciated. Skin:  Skin is warm, dry and intact. No rash noted. Psychiatric: Mood and affect are normal. Patient exhibits appropriate insight and judgment. ____________________________________________  INITIAL IMPRESSION / ASSESSMENT AND PLAN / ED COURSE  Patient with a flare of her bilateral rotator cuff tendinitis. She will be encouraged to the distal circumflex Naprosyn as quickly prescribed. We will add a perception for Flexeril to dose for acute muscle pain. She is encouraged follow with a primary care provider at [facility] clinic or consider follow-up with [facility] for rotator cuff injections. ____________________________________________  FINAL CLINICAL IMPRESSION(S) / ED DIAGNOSES  Final diagnoses:  Rotator cuff (capsule) sprain and strain, left, initial encounter  Rotator cuff (capsule) sprain and strain, right, initial encounter      Lissa Hoard, PA-C 12/21/15 1918  Emily Filbert, MD 12/22/15 [phone]

## 2015-12-21 NOTE — ED Notes (Signed)
Pt to ed with c/o body aches all over especially worse in both arms and across back and shoulders.  Pt also reports pain in bilat feet and ankles states "its diabetic foot pain"  Pt also reports pain in bilat ears.  Denies fever, denies cough, reports she has had increased activity recently and has been washing clothes and folding them.  states this causes her to raise her arms above her head.  Reports cold air makes pain worse.  Pt states heat makes pain a little better.

## 2015-12-21 NOTE — ED Notes (Signed)
See triage note.  Pt reports bilat earaches x 3 days.  Pt also reports pain in bilat arms and shoulders after increased activity at home.  Pt also states bilat lower foot pain.

## 2015-12-21 NOTE — Discharge Instructions (Signed)
Muscle Strain A muscle strain (pulled muscle) happens when a muscle is stretched beyond normal length. It happens when a sudden, violent force stretches your muscle too far. Usually, a few of the fibers in your muscle are torn. Muscle strain is common in athletes. Recovery usually takes 1-2 weeks. Complete healing takes 5-6 weeks.  HOME CARE   Follow the PRICE method of treatment to help your injury get better. Do this the first 2-3 days after the injury:  Protect. Protect the muscle to keep it from getting injured again.  Rest. Limit your activity and rest the injured body part.  Ice. Put ice in a plastic bag. Place a towel between your skin and the bag. Then, apply the ice and leave it on from 15-20 minutes each hour. After the third day, switch to moist heat packs.  Compression. Use a splint or elastic bandage on the injured area for comfort. Do not put it on too tightly.  Elevate. Keep the injured body part above the level of your heart.  Only take medicine as told by your doctor.  Warm up before doing exercise to prevent future muscle strains. GET HELP IF:   You have more pain or puffiness (swelling) in the injured area.  You feel numbness, tingling, or notice a loss of strength in the injured area. MAKE SURE YOU:   Understand these instructions.  Will watch your condition.  Will get help right away if you are not doing well or get worse.   This information is not intended to replace advice given to you by your health care provider. Make sure you discuss any questions you have with your health care provider.   Document Released: 07/08/2008 Document Revised: 07/20/2013 Document Reviewed: 04/28/2013 Elsevier Interactive Patient Education [employer]2016 [employer]   Your exam is likely due to repetitive use of the arms. You have likely flared the muscle and tendons of the rotator cuffs. Continue to dose the naproxen previously prescribed. Start the Flexeril for additional pain and  spasm relief. [facility] provider [facility] or [facility].

## 2015-12-25 ENCOUNTER — Telehealth: Payer: Self-pay

## 2015-12-25 NOTE — Telephone Encounter (Signed)
Patient lm to schedule ed follow up appointment. [phone] Called patient she schedule appoint ment for Jan 08, 2016 pm

## 2015-12-31 DIAGNOSIS — Z794 Long term (current) use of insulin: Principal | ICD-10-CM

## 2015-12-31 DIAGNOSIS — B1920 Unspecified viral hepatitis C without hepatic coma: Secondary | ICD-10-CM

## 2015-12-31 DIAGNOSIS — E119 Type 2 diabetes mellitus without complications: Secondary | ICD-10-CM

## 2016-01-08 ENCOUNTER — Ambulatory Visit: Payer: Self-pay | Admitting: Nurse Practitioner

## 2016-01-08 VITALS — BP 130/95 | HR 100 | Temp 98.3°F | Wt 166.0 lb

## 2016-01-08 DIAGNOSIS — I10 Essential (primary) hypertension: Secondary | ICD-10-CM

## 2016-01-08 DIAGNOSIS — E119 Type 2 diabetes mellitus without complications: Secondary | ICD-10-CM

## 2016-01-08 DIAGNOSIS — M25512 Pain in left shoulder: Secondary | ICD-10-CM

## 2016-01-08 LAB — GLUCOSE, POCT (MANUAL RESULT ENTRY): POC Glucose: 205 mg/dl — AB (ref 70–99)

## 2016-01-08 MED ORDER — CYCLOBENZAPRINE HCL 5 MG PO TABS: 10.0000 mg | ORAL_TABLET | Freq: Three times a day (TID) | ORAL | Status: DC | PRN

## 2016-01-08 MED ORDER — LISINOPRIL 10 MG PO TABS: 10.0000 mg | ORAL_TABLET | Freq: Every day | ORAL | Status: DC

## 2016-01-08 NOTE — Progress Notes (Signed)
   Subjective:    Patient ID: Dana PettyFlorene Esparza, female    DOB: [birth year], 55 y.o.   MRN: [id number]  HPI  Dana FlesherWent to er, 03/10,   Review of Systems  Cardiovascular: Negative for chest pain and leg swelling.  Musculoskeletal: Positive for arthralgias.  has chronic L shoulder pain, vs bursitis, or cervical radiculopathy     Objective:   Physical Exam  Neck: No thyromegaly present.  Cardiovascular: Normal rate and regular rhythm.   Pulmonary/Chest: Effort normal and breath sounds normal.  Musculoskeletal: She exhibits tenderness.   L shoulder pain       Assessment & Plan:  Will restart on Lisinopril 10 mg  For shoulder will renew the flexeril and please refer to ortho for eval and management.

## 2016-01-18 ENCOUNTER — Emergency Department
Admission: EM | Admit: 2016-01-18 | Discharge: 2016-01-18 | Disposition: A | Discharge status: Home / Self Care | Payer: Medicaid Other | Attending: Emergency Medicine | Admitting: Emergency Medicine

## 2016-01-18 ENCOUNTER — Encounter: Payer: Self-pay | Admitting: Emergency Medicine

## 2016-01-18 DIAGNOSIS — T464X5A Adverse effect of angiotensin-converting-enzyme inhibitors, initial encounter: Secondary | ICD-10-CM | POA: Insufficient documentation

## 2016-01-18 DIAGNOSIS — L501 Idiopathic urticaria: Secondary | ICD-10-CM | POA: Diagnosis not present

## 2016-01-18 DIAGNOSIS — Z888 Allergy status to other drugs, medicaments and biological substances status: Secondary | ICD-10-CM

## 2016-01-18 DIAGNOSIS — E1140 Type 2 diabetes mellitus with diabetic neuropathy, unspecified: Secondary | ICD-10-CM | POA: Insufficient documentation

## 2016-01-18 DIAGNOSIS — Z794 Long term (current) use of insulin: Secondary | ICD-10-CM | POA: Diagnosis not present

## 2016-01-18 DIAGNOSIS — L508 Other urticaria: Secondary | ICD-10-CM | POA: Diagnosis not present

## 2016-01-18 DIAGNOSIS — Z7984 Long term (current) use of oral hypoglycemic drugs: Secondary | ICD-10-CM | POA: Insufficient documentation

## 2016-01-18 DIAGNOSIS — Z86718 Personal history of other venous thrombosis and embolism: Secondary | ICD-10-CM | POA: Insufficient documentation

## 2016-01-18 DIAGNOSIS — Z885 Allergy status to narcotic agent status: Secondary | ICD-10-CM | POA: Diagnosis not present

## 2016-01-18 DIAGNOSIS — Z79899 Other long term (current) drug therapy: Secondary | ICD-10-CM | POA: Insufficient documentation

## 2016-01-18 DIAGNOSIS — Z8619 Personal history of other infectious and parasitic diseases: Secondary | ICD-10-CM | POA: Diagnosis not present

## 2016-01-18 DIAGNOSIS — Z91018 Allergy to other foods: Secondary | ICD-10-CM | POA: Insufficient documentation

## 2016-01-18 DIAGNOSIS — I10 Essential (primary) hypertension: Secondary | ICD-10-CM | POA: Diagnosis not present

## 2016-01-18 DIAGNOSIS — T7840XA Allergy, unspecified, initial encounter: Secondary | ICD-10-CM | POA: Diagnosis present

## 2016-01-18 DIAGNOSIS — Z90710 Acquired absence of both cervix and uterus: Secondary | ICD-10-CM | POA: Insufficient documentation

## 2016-01-18 LAB — CBC WITH DIFFERENTIAL/PLATELET
BASOS PCT: 1 %
Basophils Absolute: 0 10*3/uL (ref 0–0.1)
EOS ABS: 0.2 10*3/uL (ref 0–0.7)
Eosinophils Relative: 3 %
HCT: 41.8 % (ref 35.0–47.0)
Hemoglobin: 14.3 g/dL (ref 12.0–16.0)
LYMPHS ABS: 2.4 10*3/uL (ref 1.0–3.6)
Lymphocytes Relative: 30 %
MCH: 29.8 pg (ref 26.0–34.0)
MCHC: 34.1 g/dL (ref 32.0–36.0)
MCV: 87.2 fL (ref 80.0–100.0)
MONO ABS: 0.7 10*3/uL (ref 0.2–0.9)
MONOS PCT: 9 %
Neutro Abs: 4.6 10*3/uL (ref 1.4–6.5)
Neutrophils Relative %: 57 %
Platelets: 198 10*3/uL (ref 150–440)
RBC: 4.79 MIL/uL (ref 3.80–5.20)
RDW: 13.5 % (ref 11.5–14.5)
WBC: 8 10*3/uL (ref 3.6–11.0)

## 2016-01-18 LAB — SEDIMENTATION RATE: SED RATE: 10 mm/h (ref 0–30)

## 2016-01-18 LAB — TSH: TSH: 2.734 u[IU]/mL (ref 0.350–4.500)

## 2016-01-18 MED ORDER — VERAPAMIL HCL 80 MG PO TABS: 80.0000 mg | ORAL_TABLET | Freq: Three times a day (TID) | ORAL | Status: AC

## 2016-01-18 MED ORDER — BACLOFEN 10 MG PO TABS: 10.0000 mg | ORAL_TABLET | Freq: Three times a day (TID) | ORAL | Status: DC

## 2016-01-18 MED ORDER — HYDROXYZINE PAMOATE 25 MG PO CAPS: 25.0000 mg | ORAL_CAPSULE | Freq: Three times a day (TID) | ORAL | Status: AC | PRN

## 2016-01-18 NOTE — Discharge Instructions (Signed)
Angioedema °Angioedema is a sudden swelling of tissues, often of the skin. It can occur on the face or genitals or in the abdomen or other body parts. The swelling usually develops over a short period and gets better in 24 to 48 hours. It often begins during the night and is found when the person wakes up. The person may also get red, itchy patches of skin (hives). Angioedema can be dangerous if it involves swelling of the air passages.  °Depending on the cause, episodes of angioedema may only happen once, come back in unpredictable patterns, or repeat for several years and then gradually fade away.  °CAUSES  °Angioedema can be caused by an allergic reaction to various triggers. It can also result from nonallergic causes, including reactions to drugs, immune system disorders, viral infections, or an abnormal gene that is passed to you from your parents (hereditary). For some people with angioedema, the cause is unknown.  °Some things that can trigger angioedema include:  °· Foods.   °· Medicines, such as ACE inhibitors, ARBs, nonsteroidal anti-inflammatory agents, or estrogen.   °· Latex.   °· Animal saliva.   °· Insect stings.   °· Dyes used in X-rays.   °· Mild injury.   °· Dental work. °· Surgery. °· Stress.   °· Sudden changes in temperature.   °· Exercise. °SIGNS AND SYMPTOMS  °· Swelling of the skin. °· Hives. If these are present, there is also intense itching. °· Redness in the affected area.   °· Pain in the affected area. °· Swollen lips or tongue. °· Breathing problems. This may happen if the air passages swell. °· Wheezing. °If internal organs are involved, there may be:  °· Nausea.   °· Abdominal pain.   °· Vomiting.   °· Difficulty swallowing.   °· Difficulty passing urine. °DIAGNOSIS  °· Your health care provider will examine the affected area and take a medical and family history. °· Various tests may be done to help determine the cause. Tests may include: °· Allergy skin tests to see if the problem  is an allergic reaction.   °· Blood tests to check for hereditary angioedema.   °· Tests to check for underlying diseases that could cause the condition.   °· A review of your medicines, including over-the-counter medicines, may be done. °TREATMENT  °Treatment will depend on the cause of the angioedema. Possible treatments include:  °· Removal of anything that triggered the condition (such as stopping certain medicines).   °· Medicines to treat symptoms or prevent attacks. Medicines given may include:   °· Antihistamines.   °· Epinephrine injection.   °· Steroids.   °· Hospitalization may be required for severe attacks. If the air passages are affected, it can be an emergency. Tubes may need to be placed to keep the airway open. °HOME CARE INSTRUCTIONS  °· Take all medicines as directed by your health care provider. °· If you were given medicines for emergency allergy treatment, always carry them with you. °· Wear a medical bracelet as directed by your health care provider.   °· Avoid known triggers. °SEEK MEDICAL CARE IF:  °· You have repeat attacks of angioedema.   °· Your attacks are more frequent or more severe despite preventive measures.   °· You have hereditary angioedema and are considering having children. It is important to discuss with your health care provider the risks of passing the condition on to your children. °SEEK IMMEDIATE MEDICAL CARE IF:  °· You have severe swelling of the mouth, tongue, or lips. °· You have difficulty breathing.   °· You have difficulty swallowing.   °· You faint. °MAKE   SURE YOU:  Understand these instructions.  Will watch your condition.  Will get help right away if you are not doing well or get worse.   This information is not intended to replace advice given to you by your health care provider. Make sure you discuss any questions you have with your health care provider.   Document Released: 12/08/2001 Document Revised: 10/20/2014 Document Reviewed:  05/23/2013 Elsevier Interactive Patient Education 2016 Elsevier Inc.  Hives Hives are itchy, red, swollen areas of the skin. They can vary in size and location on your body. Hives can come and go for hours or several days (acute hives) or for several weeks (chronic hives). Hives do not spread from person to person (noncontagious). They may get worse with scratching, exercise, and emotional stress. CAUSES   Allergic reaction to food, additives, or drugs.  Infections, including the common cold.  Illness, such as vasculitis, lupus, or thyroid disease.  Exposure to sunlight, heat, or cold.  Exercise.  Stress.  Contact with chemicals. SYMPTOMS   Red or white swollen patches on the skin. The patches may change size, shape, and location quickly and repeatedly.  Itching.  Swelling of the hands, feet, and face. This may occur if hives develop deeper in the skin. DIAGNOSIS  Your caregiver can usually tell what is wrong by performing a physical exam. Skin or blood tests may also be done to determine the cause of your hives. In some cases, the cause cannot be determined. TREATMENT  Mild cases usually get better with medicines such as antihistamines. Severe cases may require an emergency epinephrine injection. If the cause of your hives is known, treatment includes avoiding that trigger.  HOME CARE INSTRUCTIONS   Avoid causes that trigger your hives.  Take antihistamines as directed by your caregiver to reduce the severity of your hives. Non-sedating or low-sedating antihistamines are usually recommended. Do not drive while taking an antihistamine.  Take any other medicines prescribed for itching as directed by your caregiver.  Wear loose-fitting clothing.  Keep all follow-up appointments as directed by your caregiver. SEEK MEDICAL CARE IF:   You have persistent or severe itching that is not relieved with medicine.  You have painful or swollen joints. SEEK IMMEDIATE MEDICAL CARE  IF:   You have a fever.  Your tongue or lips are swollen.  You have trouble breathing or swallowing.  You feel tightness in the throat or chest.  You have abdominal pain. These problems may be the first sign of a life-threatening allergic reaction. Call your local emergency services (911 in [place] MAKE SURE YOU:   Understand these instructions.  Will watch your condition.  Will get help right away if you are not doing well or get worse.   This information is not intended to replace advice given to you by your health care provider. Make sure you discuss any questions you have with your health care provider.   Document Released: 09/29/2005 Document Revised: 10/04/2013 Document Reviewed: 12/23/2011 Elsevier Interactive Patient Education [employer] Elsevier Inc.

## 2016-01-18 NOTE — ED Notes (Signed)
Pt arrives to ER ambulatory without difficulty, pt states that she started with hives to her left hand, rt side of throat, and rt upper lip. Pt states that she took benadryl last night and it got better, states that she has had this issue for the past 4 years. Pt states that she is also having bilat shoulder pain, states that it even hurts with just the air touching her shoulder, denies injury

## 2016-01-18 NOTE — ED Provider Notes (Signed)
[facility] Emergency Department Provider Note  ____________________________________________  Time seen: Approximately 9:58 AM  I have reviewed the triage vital signs and the nursing notes.   HISTORY  Chief Complaint Joint Pain and Allergic Reaction    HPI Dana Esparza is a 55 y.o. female who presents for evaluation of swelling allergic reaction lip swollen tonsil on her hard time swallowing earlier this morning but symptoms have seemed to resolved in addition patient states that she's been having some bilateral shoulder pain. Patient states that she currently takes lisinopril for blood pressure that has been on this medicine for a short period time. States that her lip is resolved and at this urticaria and rash has been going on for 3-4 months. [facility]. Past medical history significant for diabetes hepatitis hypertension.  Past Medical History  Diagnosis Date  . Diabetes mellitus without complication (HCC)   . Hepatitis C   . Arthritis   . Neuropathy (HCC)   . Hypertension   . Migraines   . DVT (deep venous thrombosis) [facility](HCC)     Patient Active Problem List   Diagnosis Date Noted  . Diabetes (HCC) 12/04/2015  . Hepatitis C 11/20/2015    Past Surgical History  Procedure Laterality Date  . Abdominal hysterectomy    . Cesarean section      Current Outpatient Rx  Name  Route  Sig  Dispense  Refill  . baclofen (LIORESAL) 10 MG tablet   Oral   Take 1 tablet (10 mg total) by mouth 3 (three) times daily.   30 tablet   0   . cyclobenzaprine (FLEXERIL) 5 MG tablet   Oral   Take 2 tablets (10 mg total) by mouth every 8 (eight) hours as needed for muscle spasms.   12 tablet   0   . docusate sodium (COLACE) 100 MG capsule   Oral   Take 100 mg by mouth 2 (two) times daily as needed.         . gabapentin (NEURONTIN) 300 MG capsule   Oral   Take 300 mg by mouth 3 (three) times daily.         Marland Kitchen. glipiZIDE (GLUCOTROL XL) 10 MG 24 hr tablet   Oral   Take 10 mg by mouth daily.         . hydrOXYzine (ATARAX/VISTARIL) 25 MG tablet   Oral   Take 50 mg by mouth 2 (two) times daily as needed.         . hydrOXYzine (VISTARIL) 25 MG capsule   Oral   Take 1 capsule (25 mg total) by mouth 3 (three) times daily as needed.   30 capsule   0   . Insulin NPH Isophane & Regular (HUMULIN 70/30 KWIKPEN [place])   Subcutaneous   Inject 50 Units into the skin 2 (two) times daily. Inject 50 units under the skin twice daily.  Increase dose by 5 units every week until AM sugar is less than 100.  Max daily dose - 100 units twice daily.         . Liraglutide (VICTOZA [place])   Subcutaneous   Inject 0.6 mg into the skin daily. Start with 0.6 mg daily for 1 week then 1.2 mg daily for 1 week, then 1.8 mg daily.         Marland Kitchen. loratadine (CLARITIN) 10 MG tablet   Oral   Take 1 tablet (10 mg total) by mouth daily.  30 tablet   2   . naproxen (NAPROSYN) 250 MG tablet   Oral   Take 500 mg by mouth 2 (two) times daily.         Marland Kitchen omeprazole (PRILOSEC) 10 MG capsule   Oral   Take 20 mg by mouth daily.         . ranitidine (ZANTAC) 150 MG tablet   Oral   Take 1 tablet (150 mg total) by mouth 2 (two) times daily.   60 tablet   1   . verapamil (CALAN) 80 MG tablet   Oral   Take 1 tablet (80 mg total) by mouth 3 (three) times daily.   90 tablet   1     Allergies Orange fruit; Peach; Percocet; and Strawberry flavor  Family History  Problem Relation Age of Onset  . Hypertension Mother   . Arthritis Mother   . Diabetes Mother   . Cataracts Mother   . Diabetes Father   . Arthritis Father   . Alzheimer's disease Father   . Diabetes Daughter   . Diabetes Maternal Grandmother   . Diabetes Paternal Grandmother     Social History Social History  Substance Use Topics  . Smoking status: Never Smoker   . Smokeless tobacco: Never Used  . Alcohol Use: Yes     Comment: 12oz every other  day    Review of Systems Constitutional: No fever/chills Eyes: No visual changes. ENT: No sore throat.No angioedema noted in the lips. Cardiovascular: Denies chest pain. Respiratory: Denies shortness of breath. Musculoskeletal: Negative for back pain. Skin: Hives and welts in various stages throughout the body. No vesicular or pustules noted. Neurological: Negative for headaches, focal weakness or numbness.  10-point ROS otherwise negative.  ____________________________________________   PHYSICAL EXAM:  VITAL SIGNS: ED Triage Vitals  Enc Vitals Group     BP --      Pulse Rate 01/18/16 0926 104     Resp 01/18/16 0926 18     Temp 01/18/16 0926 98.2 F (36.8 C)     Temp Source 01/18/16 0926 Oral     SpO2 01/18/16 0926 100 %     Weight 01/18/16 0926 165 lb (74.844 kg)     Height 01/18/16 0926  (1.651 m)     Head Cir --      Peak Flow --      Pain Score 01/18/16 0926 9     Pain Loc --      Pain Edu? --      Excl. in GC? --     Constitutional: Alert and oriented. Well appearing and in no acute distress. Eyes: Conjunctivae are normal. PERRL. EOMI. Head: Atraumatic. Nose: No congestion/rhinnorhea. Mouth/Throat: Mucous membranes are moist.  Oropharynx non-erythematous.No angioedema of the lips noted. Neck: No stridor.  Full range of motion nontender Cardiovascular: Normal rate, regular rhythm. Grossly normal heart sounds.  Good peripheral circulation. Respiratory: Normal respiratory effort.  No retractions. Lungs CTAB. Musculoskeletal: No lower extremity tenderness nor edema.  No joint effusions. Neurologic:  Normal speech and language. No gross focal neurologic deficits are appreciated. No gait instability. Skin:  Skin is warm, dry and intact. Areas of welts and hives. Noted on the trunk legs and back. Face is unremarkable.Marland Kitchen Psychiatric: Mood and affect are normal. Speech and behavior are normal.  ____________________________________________   LABS (all labs  ordered are listed, but only abnormal results are displayed)  Labs Reviewed  CBC WITH DIFFERENTIAL/PLATELET  TSH  SEDIMENTATION RATE  PROCEDURES  Procedure(s) performed: None  Critical Care performed: No  ____________________________________________   INITIAL IMPRESSION / ASSESSMENT AND PLAN / ED COURSE  Pertinent labs & imaging results that were available during my care of the patient were reviewed by me and considered in my medical decision making (see chart for details).  Chronic idiopathic urticaria. Patient given reassurance about the diagnosis. Also could possibly be an allergic reaction lisinopril. Plan is to stop the lisinopril and a calcium channel blocker, verapamil would prefer amlodipine. [facility]. Rx given for Vistaril and changed muscle relaxer baclofen for her right shoulder pain. Patient voices no other emergency medical complaints at this time and is discharged home breathing completely with no difficulties ____________________________________________   FINAL CLINICAL IMPRESSION(S) / ED DIAGNOSES  Final diagnoses:  Chronic idiopathic urticaria  Allergy to antihypertensive drug     This chart was dictated using voice recognition software/Dragon. Despite best efforts to proofread, errors can occur which can change the meaning. Any change was purely unintentional.   Evangeline Dakin, PA-C 01/18/16 1132  Myrna Blazer, MD 01/18/16 [phone]

## 2016-01-18 NOTE — ED Notes (Signed)
Pt states she thinks her swelling/allergic reaction has gotten worse over the past few years.

## 2016-01-18 NOTE — ED Notes (Signed)
Pt states her lip was very large this am, and it went down on its own.

## 2016-01-18 NOTE — ED Notes (Signed)
Bilateral shoulder joint pain-pt states the cool air makes them hurt worse, also c/o what is possibly an allergic reaction that cause her skin to "bubble up." Pt does not know what causes them, states they don't go away with Benadryl. Left hand swelling noted, pt states that is the product of the hives that break out and then cause localized swelling. Pt appears in no distress.

## 2016-01-23 ENCOUNTER — Encounter: Payer: Self-pay | Admitting: Nurse Practitioner

## 2016-01-29 ENCOUNTER — Encounter: Payer: Self-pay | Admitting: Physician Assistant

## 2016-01-29 ENCOUNTER — Ambulatory Visit: Payer: Self-pay | Admitting: Physician Assistant

## 2016-01-29 DIAGNOSIS — I10 Essential (primary) hypertension: Secondary | ICD-10-CM | POA: Insufficient documentation

## 2016-01-29 DIAGNOSIS — E669 Obesity, unspecified: Secondary | ICD-10-CM | POA: Insufficient documentation

## 2016-01-29 NOTE — Progress Notes (Signed)
[facility] Progress Note  01/29/2016  Name: Dana PettyFlorene Walmer  MRN: [id number]  Date of Birth: [birth year]  Chief Complaint: No chief complaint on file.   HPI: HPI  Past Medical History:  Past Medical History  Diagnosis Date  . Diabetes mellitus without complication (HCC)   . Hepatitis C   . Arthritis   . Neuropathy (HCC)   . Hypertension   . Migraines   . DVT (deep venous thrombosis) (HCC)     Past Surgical History:  Past Surgical History  Procedure Laterality Date  . Abdominal hysterectomy    . Cesarean section      Past Family History:  Family History  Problem Relation Age of Onset  . Hypertension Mother   . Arthritis Mother   . Diabetes Mother   . Cataracts Mother   . Diabetes Father   . Arthritis Father   . Alzheimer's disease Father   . Diabetes Daughter   . Diabetes Maternal Grandmother   . Diabetes Paternal Grandmother     Diabetes Management:  Lab Results  Component Value Date   HGBA1C 13.6 12/04/2015    Meter here todayYes -  hypoglycemia in the last 3 monthsNo -   Highest blood sugar seen in last 1-2 months? 269  Reported blood sugar average:14 day 194  Trouble with managing blood sugar :  New Complaints: painful feet. Reports sister passed last summer which has greatly affected her self-management capabilitties and has not been taking insulin regularly especially at night.  Personal Diabetes Goal: reduced foot pain    Clinical Assessment & Plan  Determine T1D status? Address grief of lost of sister and attempt to take insulin as prescribed twice daily.Although may not actually need this much total daily dose

## 2016-02-05 ENCOUNTER — Other Ambulatory Visit: Payer: Self-pay

## 2016-02-05 DIAGNOSIS — E11649 Type 2 diabetes mellitus with hypoglycemia without coma: Secondary | ICD-10-CM

## 2016-02-06 LAB — HEMOGLOBIN A1C
Est. average glucose Bld gHb Est-mCnc: 203 mg/dL
HEMOGLOBIN A1C: 8.7 % — AB (ref 4.8–5.6)

## 2016-02-06 LAB — MICROALBUMIN / CREATININE URINE RATIO
CREATININE, UR: 52.4 mg/dL
MICROALB/CREAT RATIO: <5.7 mg/g creat (ref 0.0–30.0)

## 2016-02-14 ENCOUNTER — Other Ambulatory Visit: Payer: Self-pay | Admitting: Internal Medicine

## 2016-02-18 ENCOUNTER — Other Ambulatory Visit: Payer: Self-pay | Admitting: Internal Medicine

## 2016-02-18 ENCOUNTER — Encounter: Payer: Self-pay | Admitting: Emergency Medicine

## 2016-02-18 ENCOUNTER — Emergency Department
Admission: EM | Admit: 2016-02-18 | Discharge: 2016-02-18 | Disposition: A | Discharge status: Home / Self Care | Payer: Medicaid Other | Attending: Emergency Medicine | Admitting: Emergency Medicine

## 2016-02-18 DIAGNOSIS — S39012A Strain of muscle, fascia and tendon of lower back, initial encounter: Secondary | ICD-10-CM

## 2016-02-18 DIAGNOSIS — Z7984 Long term (current) use of oral hypoglycemic drugs: Secondary | ICD-10-CM | POA: Diagnosis not present

## 2016-02-18 DIAGNOSIS — Y999 Unspecified external cause status: Secondary | ICD-10-CM | POA: Diagnosis not present

## 2016-02-18 DIAGNOSIS — Y929 Unspecified place or not applicable: Secondary | ICD-10-CM | POA: Insufficient documentation

## 2016-02-18 DIAGNOSIS — X58XXXA Exposure to other specified factors, initial encounter: Secondary | ICD-10-CM | POA: Insufficient documentation

## 2016-02-18 DIAGNOSIS — Y939 Activity, unspecified: Secondary | ICD-10-CM | POA: Diagnosis not present

## 2016-02-18 DIAGNOSIS — Z794 Long term (current) use of insulin: Secondary | ICD-10-CM | POA: Insufficient documentation

## 2016-02-18 DIAGNOSIS — E669 Obesity, unspecified: Secondary | ICD-10-CM | POA: Insufficient documentation

## 2016-02-18 DIAGNOSIS — S46912A Strain of unspecified muscle, fascia and tendon at shoulder and upper arm level, left arm, initial encounter: Secondary | ICD-10-CM | POA: Diagnosis not present

## 2016-02-18 DIAGNOSIS — I10 Essential (primary) hypertension: Secondary | ICD-10-CM | POA: Diagnosis not present

## 2016-02-18 DIAGNOSIS — Z79899 Other long term (current) drug therapy: Secondary | ICD-10-CM | POA: Insufficient documentation

## 2016-02-18 DIAGNOSIS — M25512 Pain in left shoulder: Secondary | ICD-10-CM | POA: Diagnosis present

## 2016-02-18 DIAGNOSIS — Z86718 Personal history of other venous thrombosis and embolism: Secondary | ICD-10-CM | POA: Insufficient documentation

## 2016-02-18 DIAGNOSIS — E119 Type 2 diabetes mellitus without complications: Secondary | ICD-10-CM | POA: Insufficient documentation

## 2016-02-18 MED ORDER — MELOXICAM 15 MG PO TABS: 15.0000 mg | ORAL_TABLET | Freq: Every day | ORAL | Status: AC

## 2016-02-18 MED ORDER — BACLOFEN 10 MG PO TABS: 10.0000 mg | ORAL_TABLET | Freq: Three times a day (TID) | ORAL | Status: AC

## 2016-02-18 NOTE — ED Notes (Signed)
Back and shoulder pain after doing laundry yesterday.

## 2016-02-18 NOTE — ED Provider Notes (Signed)
[facility] Emergency Department Provider Note ____________________________________________  Time seen: Approximately 3:59 PM  I have reviewed the triage vital signs and the nursing notes.   HISTORY  Chief Complaint Back Pain    HPI Dana Esparza is a 55 y.o. female who presents to the emergency department for evaluation of bilateral shoulder pain and back pain after doing [place] yesterday. She denies any specific injury. She states that she feels like she "just overdid it a little." She has not taken anything at home for pain. She states that the left shoulder is more painful than the right and "just the cold air hitting it makes it hurt."  Past Medical History  Diagnosis Date  . Diabetes mellitus without complication (HCC)   . Hepatitis C   . Arthritis   . Neuropathy (HCC)   . Hypertension   . Migraines   . DVT (deep venous thrombosis) [facility])     Patient Active Problem List   Diagnosis Date Noted  . Obesity (BMI 35.0-39.9 without comorbidity) (HCC) 01/29/2016  . Hypertension 01/29/2016  . Diabetes (HCC) 12/04/2015  . Hepatitis C 11/20/2015    Past Surgical History  Procedure Laterality Date  . Abdominal hysterectomy    . Cesarean section      Current Outpatient Rx  Name  Route  Sig  Dispense  Refill  . baclofen (LIORESAL) 10 MG tablet   Oral   Take 1 tablet (10 mg total) by mouth 3 (three) times daily.   30 tablet   0   . docusate sodium (COLACE) 100 MG capsule   Oral   Take 100 mg by mouth 2 (two) times daily as needed.         . gabapentin (NEURONTIN) 300 MG capsule   Oral   Take 300 mg by mouth 3 (three) times daily.         Marland Kitchen glipiZIDE (GLUCOTROL XL) 10 MG 24 hr tablet   Oral   Take 10 mg by mouth daily.         . hydrOXYzine (ATARAX/VISTARIL) 25 MG tablet      TAKE TWO TABLETS BY MOUTH EVERY DAY AT BEDTIME.   60 tablet   0   . hydrOXYzine (VISTARIL) 25 MG capsule   Oral   Take 1 capsule (25 mg total) by  mouth 3 (three) times daily as needed.   30 capsule   0   . Insulin NPH Isophane & Regular (HUMULIN 70/30 KWIKPEN [place])   Subcutaneous   Inject 50 Units into the skin 2 (two) times daily. Inject 50 units under the skin twice daily.  Increase dose by 5 units every week until AM sugar is less than 100.  Max daily dose - 100 units twice daily.         . Liraglutide (VICTOZA [place])   Subcutaneous   Inject 0.6 mg into the skin daily. Start with 0.6 mg daily for 1 week then 1.2 mg daily for 1 week, then 1.8 mg daily.         Marland Kitchen loratadine (CLARITIN) 10 MG tablet   Oral   Take 1 tablet (10 mg total) by mouth daily.   30 tablet   2   . meloxicam (MOBIC) 15 MG tablet   Oral   Take 1 tablet (15 mg total) by mouth daily.   30 tablet   0   . omeprazole (PRILOSEC) 10 MG capsule   Oral   Take 20 mg by mouth daily.         Marland Kitchen  ranitidine (ZANTAC) 150 MG tablet   Oral   Take 1 tablet (150 mg total) by mouth 2 (two) times daily.   60 tablet   1   . verapamil (CALAN) 80 MG tablet   Oral   Take 1 tablet (80 mg total) by mouth 3 (three) times daily.   90 tablet   1     Allergies Orange fruit; Peach; Percocet; and Strawberry flavor  Family History  Problem Relation Age of Onset  . Hypertension Mother   . Arthritis Mother   . Diabetes Mother   . Cataracts Mother   . Diabetes Father   . Arthritis Father   . Alzheimer's disease Father   . Diabetes Daughter   . Diabetes Maternal Grandmother   . Diabetes Paternal Grandmother     Social History Social History  Substance Use Topics  . Smoking status: Never Smoker   . Smokeless tobacco: Never Used  . Alcohol Use: Yes     Comment: 12oz every other day    Review of Systems Constitutional: No recent illness. Eyes: No visual changes. ENT: No sore throat. Cardiovascular: Denies chest pain or palpitations. Respiratory: Denies shortness of breath. Gastrointestinal: No abdominal pain.  Genitourinary: Negative for  dysuria. Musculoskeletal: Pain in Bilateral shoulders and lower back Skin: Negative for rash. Neurological: Negative for headaches, focal weakness or numbness.  ____________________________________________   PHYSICAL EXAM:  VITAL SIGNS: ED Triage Vitals  Enc Vitals Group     BP 02/18/16 1458 132/84 mmHg     Pulse Rate 02/18/16 1458 89     Resp 02/18/16 1458 18     Temp 02/18/16 1458 98.3 F (36.8 C)     Temp Source 02/18/16 1458 Oral     SpO2 02/18/16 1458 99 %     Weight 02/18/16 1458 167 lb (75.751 kg)     Height 02/18/16 1458  (1.651 m)     Head Cir --      Peak Flow --      Pain Score 02/18/16 1459 9     Pain Loc --      Pain Edu? --      Excl. in GC? --     Constitutional: Alert and oriented. Well appearing and in no acute distress. Eyes: Conjunctivae are normal. EOMI. Head: Atraumatic. Nose: No congestion/rhinnorhea. Neck: No stridor.  Respiratory: Normal respiratory effort.   Musculoskeletal: Left shoulder painful with abduction greater than 50. Right shoulder with full range of motion. Normal flexion and extension and movement of the lower back and lower extremities. Neurologic:  Normal speech and language. No gross focal neurologic deficits are appreciated. Speech is normal. No gait instability. Skin:  Skin is warm, dry and intact. Atraumatic. Psychiatric: Mood and affect are normal. Speech and behavior are normal.  ____________________________________________   LABS (all labs ordered are listed, but only abnormal results are displayed)  Labs Reviewed - No data to display ____________________________________________  RADIOLOGY  Not indicated ____________________________________________   PROCEDURES  Procedure(s) performed: None   ____________________________________________   INITIAL IMPRESSION / ASSESSMENT AND PLAN / ED COURSE  Pertinent labs & imaging results that were available during my care of the patient were reviewed by me and  considered in my medical decision making (see chart for details).  Patient brought in an article from a magazine that discussed "frozen shoulder" and wondered if this was her issue. She was encouraged to review the article in full and it was advised that she does not meet the criteria  for that diagnosis.  Today, she will be prescribed baclofen and meloxicam. She was advised to avoid taking ibuprofen while taking the meloxicam. She was encouraged to follow [facility] with [facility] or [facility]ics for symptoms that are not improving over the week. Of note, patient was able to leave the department with her purse over her left shoulder and a steady gait. ____________________________________________   FINAL CLINICAL IMPRESSION(S) / ED DIAGNOSES  Final diagnoses:  Shoulder strain, left, initial encounter  Lumbosacral strain, initial encounter       Chinita PesterCari B Anberlin Diez, FNP 02/18/16 2152  Loleta Roseory Forbach, MD 02/19/16 [phone]

## 2016-02-18 NOTE — ED Notes (Signed)
See triage note.states she developed pain to back and shoulders since yesterday  Denies any fall but noticed pain after doing laundry  No deformity noted and ambulates well

## 2016-02-19 ENCOUNTER — Other Ambulatory Visit: Payer: Self-pay

## 2016-02-19 DIAGNOSIS — E118 Type 2 diabetes mellitus with unspecified complications: Secondary | ICD-10-CM

## 2016-02-20 LAB — HEMOGLOBIN A1C
Est. average glucose Bld gHb Est-mCnc: 171 mg/dL
Hgb A1c MFr Bld: 7.6 % — ABNORMAL HIGH (ref 4.8–5.6)

## 2016-02-26 ENCOUNTER — Ambulatory Visit: Payer: Self-pay

## 2016-02-28 ENCOUNTER — Other Ambulatory Visit: Payer: Self-pay | Admitting: Nurse Practitioner

## 2016-03-04 ENCOUNTER — Other Ambulatory Visit: Payer: Self-pay

## 2016-03-04 DIAGNOSIS — E119 Type 2 diabetes mellitus without complications: Secondary | ICD-10-CM

## 2016-03-04 DIAGNOSIS — Z794 Long term (current) use of insulin: Principal | ICD-10-CM

## 2016-03-05 LAB — LIPID PANEL
Chol/HDL Ratio: 3.9 ratio units (ref 0.0–4.4)
Cholesterol, Total: 158 mg/dL (ref 100–199)
HDL: 41 mg/dL (ref >39)
LDL CALC: 84 mg/dL (ref 0–99)
Triglycerides: 164 mg/dL — ABNORMAL HIGH (ref 0–149)
VLDL CHOLESTEROL CAL: 33 mg/dL (ref 5–40)

## 2016-03-27 ENCOUNTER — Other Ambulatory Visit: Payer: Self-pay | Admitting: Nurse Practitioner

## 2016-05-26 ENCOUNTER — Other Ambulatory Visit: Payer: Self-pay | Admitting: Urology

## 2016-05-27 ENCOUNTER — Other Ambulatory Visit: Payer: Self-pay | Admitting: Nurse Practitioner

## 2016-06-23 ENCOUNTER — Emergency Department
Admission: EM | Admit: 2016-06-23 | Discharge: 2016-06-23 | Disposition: A | Discharge status: Home / Self Care | Payer: Medicaid Other | Attending: Student in an Organized Health Care Education/Training Program | Admitting: Student in an Organized Health Care Education/Training Program

## 2016-06-23 ENCOUNTER — Encounter: Payer: Self-pay | Admitting: *Deleted

## 2016-06-23 DIAGNOSIS — Z7984 Long term (current) use of oral hypoglycemic drugs: Secondary | ICD-10-CM | POA: Diagnosis not present

## 2016-06-23 DIAGNOSIS — E119 Type 2 diabetes mellitus without complications: Secondary | ICD-10-CM | POA: Diagnosis not present

## 2016-06-23 DIAGNOSIS — I10 Essential (primary) hypertension: Secondary | ICD-10-CM | POA: Insufficient documentation

## 2016-06-23 DIAGNOSIS — Z79899 Other long term (current) drug therapy: Secondary | ICD-10-CM | POA: Insufficient documentation

## 2016-06-23 DIAGNOSIS — L508 Other urticaria: Secondary | ICD-10-CM | POA: Diagnosis not present

## 2016-06-23 DIAGNOSIS — Z794 Long term (current) use of insulin: Secondary | ICD-10-CM | POA: Diagnosis not present

## 2016-06-23 DIAGNOSIS — L509 Urticaria, unspecified: Secondary | ICD-10-CM | POA: Diagnosis present

## 2016-06-23 MED ORDER — METHYLPREDNISOLONE 4 MG PO TBPK: ORAL_TABLET | ORAL | 0 refills | Status: AC

## 2016-06-23 MED ORDER — HYDROXYZINE HCL 50 MG PO TABS
50.0000 mg | ORAL_TABLET | Freq: Once | ORAL | Status: AC
  Administered 2016-06-23: 50 mg via ORAL
  Filled 2016-06-23: qty 1

## 2016-06-23 MED ORDER — HYDROXYZINE HCL 50 MG PO TABS: 50.0000 mg | ORAL_TABLET | Freq: Three times a day (TID) | ORAL | 0 refills | Status: DC | PRN

## 2016-06-23 MED ORDER — DEXAMETHASONE SODIUM PHOSPHATE 10 MG/ML IJ SOLN
10.0000 mg | Freq: Once | INTRAMUSCULAR | Status: AC
  Administered 2016-06-23: 10 mg via INTRAMUSCULAR
  Filled 2016-06-23: qty 1

## 2016-06-23 NOTE — ED Triage Notes (Signed)
Pt states hives on her legs and abd, states she was taking hydroxine and felt better but she ran out, given to [facility] by [facility]

## 2016-06-23 NOTE — ED Provider Notes (Signed)
[facility] Emergency Department Provider Note   ____________________________________________   None    (approximate)  I have reviewed the triage vital signs and the nursing notes.   HISTORY  Chief Complaint Urticaria    HPI Rennis PettyFlorene Alto is a 55 y.o. female patient complain of intermittent rash on her legs and abdomen. Patient states a long-standing condition over 3 years. Patient normally resolves with Benadryl. Patient states she's not been evaluated by dermatologist but has [facility]. Patient denies any new personal hygiene products patient denies any new foods patient states intense itching with this complaint. Except for over-the-counter Benadryl no palliative measures complaint. Patient stating the past she responded well to Atarax.He rates the pain discomfort as a 3/10. Patient complain mostly of itching.   Past Medical History:  Diagnosis Date  . Arthritis   . Diabetes mellitus without complication (HCC)   . DVT (deep venous thrombosis) (HCC)   . Hepatitis C   . Hypertension   . Migraines   . Neuropathy [facility](HCC)     Patient Active Problem List   Diagnosis Date Noted  . Obesity (BMI 35.0-39.9 without comorbidity) (HCC) 01/29/2016  . Hypertension 01/29/2016  . Diabetes (HCC) 12/04/2015  . Hepatitis C 11/20/2015    Past Surgical History:  Procedure Laterality Date  . ABDOMINAL HYSTERECTOMY    . CESAREAN SECTION      Prior to Admission medications   Medication Sig Start Date End Date Taking? Authorizing Provider  baclofen (LIORESAL) 10 MG tablet Take 1 tablet (10 mg total) by mouth 3 (three) times daily. 02/18/16   Chinita Pesterari B Triplett, FNP  docusate sodium (COLACE) 100 MG capsule Take 100 mg by mouth 2 (two) times daily as needed. 05/08/15   Historical Provider, MD  gabapentin (NEURONTIN) 300 MG capsule Take 300 mg by mouth 3 (three) times daily.    Historical Provider, MD  glipiZIDE (GLUCOTROL XL) 10 MG 24 hr tablet  Take 10 mg by mouth daily.    Historical Provider, MD  HUMULIN 70/30 KWIKPEN (70-30) 100 UNIT/ML PEN INJECT 100 UNITS UNDER THE SKIN 2 TIMES A DAY. MAX DAILY DOSE IS 200 UNITS. 04/01/16   Carollee HerterShannon A McGowan, PA-C  hydrOXYzine (ATARAX/VISTARIL) 25 MG tablet TAKE TWO TABLETS BY MOUTH EVERY DAY AT BEDTIME. 02/14/16   Harle BattiestShannon A McGowan, PA-C  hydrOXYzine (ATARAX/VISTARIL) 50 MG tablet Take 1 tablet (50 mg total) by mouth 3 (three) times daily as needed. 06/23/16   Joni Reiningonald K Jervon Ream, PA-C  hydrOXYzine (VISTARIL) 25 MG capsule Take 1 capsule (25 mg total) by mouth 3 (three) times daily as needed. 01/18/16   Charmayne Sheerharles M Beers, PA-C  Insulin NPH Isophane & Regular (HUMULIN 70/30 KWIKPEN [place]) Inject 50 Units into the skin 2 (two) times daily. Inject 50 units under the skin twice daily.  Increase dose by 5 units every week until AM sugar is less than 100.  Max daily dose - 100 units twice daily.    Historical Provider, MD  Liraglutide (VICTOZA [place]) Inject 0.6 mg into the skin daily. Start with 0.6 mg daily for 1 week then 1.2 mg daily for 1 week, then 1.8 mg daily. 12/05/15   Historical Provider, MD  loratadine (CLARITIN) 10 MG tablet Take 1 tablet (10 mg total) by mouth daily. 10/11/15 10/10/16  Tommi Rumpshonda L Summers, PA-C  meloxicam (MOBIC) 15 MG tablet Take 1 tablet (15 mg total) by mouth daily. 02/18/16   Chinita Pesterari B Triplett, FNP  methylPREDNISolone (MEDROL DOSEPAK) 4 MG  TBPK tablet Take Tapered dose as directed 06/23/16   Joni Reining, PA-C  omeprazole (PRILOSEC) 10 MG capsule Take 20 mg by mouth daily.    Historical Provider, MD  omeprazole (PRILOSEC) 20 MG capsule TAKE ONE CAPSULE BY MOUTH EVERY DAY. REPLACES NEXIUM. 05/27/16   Zachery Dauer, FNP  ranitidine (ZANTAC) 150 MG tablet Take 1 tablet (150 mg total) by mouth 2 (two) times daily. 10/11/15 10/10/16  Tommi Rumps, PA-C  verapamil (CALAN) 80 MG tablet Take 1 tablet (80 mg total) by mouth 3 (three) times daily. 01/18/16 01/17/17  Evangeline Dakin, PA-C    Allergies Orange  fruit [citrus]; Peach [prunus persica]; Percocet [oxycodone-acetaminophen]; and Strawberry flavor  Family History  Problem Relation Age of Onset  . Hypertension Mother   . Arthritis Mother   . Diabetes Mother   . Cataracts Mother   . Diabetes Father   . Arthritis Father   . Alzheimer's disease Father   . Diabetes Daughter   . Diabetes Maternal Grandmother   . Diabetes Paternal Grandmother     Social History Social History  Substance Use Topics  . Smoking status: Never Smoker  . Smokeless tobacco: Never Used  . Alcohol use Yes     Comment: 12oz every other day    Review of Systems Constitutional: No fever/chills Eyes: No visual changes. ENT: No sore throat. Cardiovascular: Denies chest pain. Respiratory: Denies shortness of breath. Gastrointestinal: No abdominal pain.  No nausea, no vomiting.  No diarrhea.  No constipation. Genitourinary: Negative for dysuria. Musculoskeletal: Negative for back pain. Skin: Positive for rash. Neurological: Negative for headaches, focal weakness or numbness. Endocrine:Diabetes and hypertension. Hematological/Lymphatic: Allergic/Immunilogical: Patient's allergies to oranges Strawberries and hydrocodone. 10-point ROS otherwise negative.  ____________________________________________   PHYSICAL EXAM:  VITAL SIGNS: ED Triage Vitals [06/23/16 1354]  Enc Vitals Group     BP (!) 142/94     Pulse Rate 81     Resp 18     Temp 98.1 F (36.7 C)     Temp Source Oral     SpO2 100 %     Weight 175 lb (79.4 kg)     Height 5\' 6"  (1.676 m)     Head Circumference      Peak Flow      Pain Score 3     Pain Loc      Pain Edu?      Excl. in GC?     Constitutional: Alert and oriented. Well appearing and in no acute distress. Eyes: Conjunctivae are normal. PERRL. EOMI. Head: Atraumatic. Nose: No congestion/rhinnorhea. Mouth/Throat: Mucous membranes are moist.  Oropharynx non-erythematous. Neck: No stridor.  No cervical spine tenderness to  palpation. Hematological/Lymphatic/Immunilogical: No cervical lymphadenopathy. Cardiovascular: Normal rate, regular rhythm. Grossly normal heart sounds.  Good peripheral circulation. Respiratory: Normal respiratory effort.  No retractions. Lungs CTAB. Gastrointestinal: Soft and nontender. No distention. No abdominal bruits. No CVA tenderness. Musculoskeletal: No lower extremity tenderness nor edema.  No joint effusions. Neurologic:  Normal speech and language. No gross focal neurologic deficits are appreciated. No gait instability. Skin:  Skin is warm, dry and intact. Diffuse macular lesions from abdomen radiating to the bilateral lower extremities. Upper extremities are spared. Psychiatric: Mood and affect are normal. Speech and behavior are normal.  ____________________________________________   LABS (all labs ordered are listed, but only abnormal results are displayed)  Labs Reviewed - No data to display ____________________________________________  EKG   ____________________________________________  RADIOLOGY   ____________________________________________   PROCEDURES  Procedure(s) performed: None  Procedures  Critical Care performed: No  ____________________________________________   INITIAL IMPRESSION / ASSESSMENT AND PLAN / ED COURSE  Pertinent labs & imaging results that were available during my care of the patient were reviewed by me and considered in my medical decision making (see chart for details).  Chronic Urticaria. Patient given discharge care instructions. Patient given a prescription for Atarax and Medrol Dosepak. Patient advised to follow-up with dermatology for definitive evaluation and treatment.  Clinical Course     ____________________________________________   FINAL CLINICAL IMPRESSION(S) / ED DIAGNOSES  Final diagnoses:  Urticaria, chronic      NEW MEDICATIONS STARTED DURING THIS VISIT:  New Prescriptions   HYDROXYZINE  (ATARAX/VISTARIL) 50 MG TABLET    Take 1 tablet (50 mg total) by mouth 3 (three) times daily as needed.   METHYLPREDNISOLONE (MEDROL DOSEPAK) 4 MG TBPK TABLET    Take Tapered dose as directed     Note:  This document was prepared using Dragon voice recognition software and may include unintentional dictation errors.    Joni Reining, PA-C 06/23/16 1429    Willy Eddy, MD 06/23/16 [id number]

## 2016-06-23 NOTE — ED Notes (Signed)
See triage note. Developed itching and hives a few days ago   Hx of same  Ran out of vistaril  No resp issues noted

## 2016-07-20 ENCOUNTER — Encounter: Payer: Self-pay | Admitting: Emergency Medicine

## 2016-07-20 ENCOUNTER — Emergency Department
Admission: EM | Admit: 2016-07-20 | Discharge: 2016-07-20 | Disposition: A | Discharge status: Home / Self Care | Payer: Medicaid Other | Attending: Emergency Medicine | Admitting: Emergency Medicine

## 2016-07-20 DIAGNOSIS — Z79899 Other long term (current) drug therapy: Secondary | ICD-10-CM | POA: Insufficient documentation

## 2016-07-20 DIAGNOSIS — R739 Hyperglycemia, unspecified: Secondary | ICD-10-CM

## 2016-07-20 DIAGNOSIS — I10 Essential (primary) hypertension: Secondary | ICD-10-CM | POA: Insufficient documentation

## 2016-07-20 DIAGNOSIS — Z794 Long term (current) use of insulin: Secondary | ICD-10-CM | POA: Diagnosis not present

## 2016-07-20 DIAGNOSIS — E1165 Type 2 diabetes mellitus with hyperglycemia: Secondary | ICD-10-CM | POA: Insufficient documentation

## 2016-07-20 DIAGNOSIS — L509 Urticaria, unspecified: Secondary | ICD-10-CM | POA: Diagnosis not present

## 2016-07-20 LAB — GLUCOSE, CAPILLARY
GLUCOSE-CAPILLARY: 301 mg/dL — AB (ref 65–99)
Glucose-Capillary: 85 mg/dL (ref 65–99)

## 2016-07-20 LAB — URINALYSIS COMPLETE WITH MICROSCOPIC (ARMC ONLY)
Bilirubin Urine: NEGATIVE
Glucose, UA: >500 mg/dL — AB
Hgb urine dipstick: NEGATIVE
KETONES UR: NEGATIVE mg/dL
LEUKOCYTES UA: NEGATIVE
NITRITE: NEGATIVE
PROTEIN: NEGATIVE mg/dL
SPECIFIC GRAVITY, URINE: 1.028 (ref 1.005–1.030)
pH: 6 (ref 5.0–8.0)

## 2016-07-20 LAB — CBC
HCT: 42.4 % (ref 35.0–47.0)
HEMOGLOBIN: 14.4 g/dL (ref 12.0–16.0)
MCH: 29.2 pg (ref 26.0–34.0)
MCHC: 33.9 g/dL (ref 32.0–36.0)
MCV: 86.2 fL (ref 80.0–100.0)
Platelets: 189 10*3/uL (ref 150–440)
RBC: 4.92 MIL/uL (ref 3.80–5.20)
RDW: 13.1 % (ref 11.5–14.5)
WBC: 6.9 10*3/uL (ref 3.6–11.0)

## 2016-07-20 LAB — BASIC METABOLIC PANEL
ANION GAP: 8 (ref 5–15)
BUN: 14 mg/dL (ref 6–20)
CALCIUM: 9.6 mg/dL (ref 8.9–10.3)
CO2: 24 mmol/L (ref 22–32)
Chloride: 101 mmol/L (ref 101–111)
Creatinine, Ser: 0.8 mg/dL (ref 0.44–1.00)
GFR calc Af Amer: >60 mL/min (ref >60)
GFR calc non Af Amer: >60 mL/min (ref >60)
GLUCOSE: 313 mg/dL — AB (ref 65–99)
Potassium: 3.3 mmol/L — ABNORMAL LOW (ref 3.5–5.1)
Sodium: 133 mmol/L — ABNORMAL LOW (ref 135–145)

## 2016-07-20 MED ORDER — HYDROXYZINE HCL 25 MG PO TABS
50.0000 mg | ORAL_TABLET | Freq: Once | ORAL | Status: AC
  Administered 2016-07-20: 50 mg via ORAL
  Filled 2016-07-20: qty 2

## 2016-07-20 MED ORDER — SODIUM CHLORIDE 0.9 % IV BOLUS (SEPSIS)
1000.0000 mL | Freq: Once | INTRAVENOUS | Status: AC
  Administered 2016-07-20: 1000 mL via INTRAVENOUS

## 2016-07-20 MED ORDER — INSULIN ASPART 100 UNIT/ML ~~LOC~~ SOLN
6.0000 [IU] | Freq: Once | SUBCUTANEOUS | Status: AC
  Administered 2016-07-20: 6 [IU] via INTRAVENOUS
  Filled 2016-07-20: qty 6

## 2016-07-20 MED ORDER — HYDROXYZINE HCL 25 MG PO TABS: 25.0000 mg | ORAL_TABLET | Freq: Three times a day (TID) | ORAL | 0 refills | Status: AC | PRN

## 2016-07-20 NOTE — Discharge Instructions (Signed)
Please return immediately if condition worsens. Please contact her primary physician or the physician you were given for referral. If you have any specialist physicians involved in her treatment and plan please also contact them. Thank you for using [facility]. ° °

## 2016-07-20 NOTE — ED Triage Notes (Signed)
Pt states her CBG was 580 yesterday, has not checked it today. Also c/o hives with itching that started this morning, had been prescribed 50mg  atarax but states it doesn't work as well as the 25mg , has had rashes intermittently since 06/23/16. Denies recent illness. States she has taken 70/30 insulin yesterday but has not taken it today. Denies n/v.

## 2016-07-20 NOTE — ED Provider Notes (Signed)
Time Seen: Approximately 1200  I have reviewed the triage notes  Chief Complaint: Hyperglycemia and Rash   History of Present Illness: Dana Esparza is a 55 y.o. female he states that she noticed yesterday that her blood sugar was running elevated regardless of taking her normal insulin. She states she is on 70/30 twice a day. Patient denies being on any form of steroids at this point. She denies any other physical complaints other than some hives and has had intermittent hives now for a very extended period of time. She states that she's never seen a dermatologist and normally hydroxyzine works better than Benadryl but she was out of the medication. She denies any fever, chills. She denies any burning with urination but has noticed some urinary frequency. She did not take her insulin yet this morning and when she was seen and evaluated by the nursing staff she had breakfast from [employer] sitting on the counter. Patient denies any productive cough or wheezing, fever, nausea, vomiting, abdominal pain.   Past Medical History:  Diagnosis Date  . Arthritis   . Diabetes mellitus without complication (HCC)   . DVT (deep venous thrombosis) (HCC)   . Hepatitis C   . Hypertension   . Migraines   . Neuropathy [facility](HCC)     Patient Active Problem List   Diagnosis Date Noted  . Obesity (BMI 35.0-39.9 without comorbidity) 01/29/2016  . Hypertension 01/29/2016  . Diabetes (HCC) 12/04/2015  . Hepatitis C 11/20/2015    Past Surgical History:  Procedure Laterality Date  . ABDOMINAL HYSTERECTOMY    . CESAREAN SECTION      Past Surgical History:  Procedure Laterality Date  . ABDOMINAL HYSTERECTOMY    . CESAREAN SECTION      Current Outpatient Rx  . Order #: [id number] Class: Print  . Order #: [id number] Class: Historical Med  . Order #: [id number] Class: Historical Med  . Order #: [id number] Class: Historical Med  . Order #: [id number] Class: Normal  . Order #: [id number] Class: Normal  .  Order #: [id number] Class: Print  . Order #: [id number] Class: Print  . Order #: [id number] Class: Historical Med  . Order #: [id number] Class: Historical Med  . Order #: [id number] Class: Print  . Order #: [id number] Class: Print  . Order #: [id number] Class: Print  . Order #: [id number] Class: Historical Med  . Order #: [id number] Class: Normal  . Order #: [id number] Class: Print  . Order #: [id number] Class: Print    Allergies:  Orange fruit [citrus]; Peach [prunus persica]; Percocet [oxycodone-acetaminophen]; and Strawberry flavor  Family History: Family History  Problem Relation Age of Onset  . Hypertension Mother   . Arthritis Mother   . Diabetes Mother   . Cataracts Mother   . Diabetes Father   . Arthritis Father   . Alzheimer's disease Father   . Diabetes Daughter   . Diabetes Maternal Grandmother   . Diabetes Paternal Grandmother     Social History: Social History  Substance Use Topics  . Smoking status: Never Smoker  . Smokeless tobacco: Never Used  . Alcohol use Yes     Comment: 12oz every other day     Review of Systems:   10 point review of systems was performed and was otherwise negative:  Constitutional: No fever Eyes: No visual disturbances ENT: No sore throat, ear pain Cardiac: No chest pain Respiratory: No shortness of breath, wheezing, or stridor Abdomen: No abdominal pain, no vomiting, No diarrhea Endocrine: No weight loss, No night sweats Extremities: No peripheral edema, cyanosis Skin: No rashes, easy bruising  Neurologic: No focal weakness, trouble with speech or swollowing Urologic: No dysuria, Hematuria, She denies any risk of being pregnant   Physical Exam:  ED Triage Vitals  Enc Vitals Group     BP 07/20/16 1111 130/90     Pulse Rate 07/20/16 1111 99     Resp 07/20/16 1111 18     Temp 07/20/16 1111 98.6 F (37 C)     Temp Source 07/20/16 1111 Oral     SpO2 07/20/16 1111 99 %     Weight 07/20/16 1112 175 lb (79.4 kg)     Height 07/20/16 1112 5'  6" (1.676 m)     Head Circumference --      Peak Flow --      Pain Score 07/20/16 1117 0     Pain Loc --      Pain Edu? --      Excl. in GC? --     General: Awake , Alert , and Oriented times 3; GCS 15 Head: Normal cephalic , atraumatic Eyes: Pupils equal , round, reactive to light Nose/Throat: No nasal drainage, patent upper airway without erythema or exudate.  Neck: Supple, Full range of motion, No anterior adenopathy or palpable thyroid masses Lungs: Clear to ascultation without wheezes , rhonchi, or rales Heart: Regular rate, regular rhythm without murmurs , gallops , or rubs Abdomen: Soft, non tender without rebound, guarding , or rigidity; bowel sounds positive and symmetric in all 4 quadrants. No organomegaly .        Extremities: 2 plus symmetric pulses. No edema, clubbing or cyanosis Neurologic: normal ambulation, Motor symmetric without deficits, sensory intact Skin: warm, dry, no rashes   Labs:   All laboratory work was reviewed including any pertinent negatives or positives listed below:  Labs Reviewed  GLUCOSE, CAPILLARY - Abnormal; Notable for the following:       Result Value   Glucose-Capillary 301 (*)    All other components within normal limits  BASIC METABOLIC PANEL - Abnormal; Notable for the following:    Sodium 133 (*)    Potassium 3.3 (*)    Glucose, Bld 313 (*)    All other components within normal limits  CBC  URINALYSIS COMPLETEWITH MICROSCOPIC ([facility] ONLY)  BLOOD GAS, VENOUS  CBG MONITORING, ED    ED Course: * Patient was given a IV fluid bolus and some insulin here in emergency department. Laboratory work and workup was primarily designed to make sure that she was not in diabetic ketoacidosis and the descriptions of her high blood sugar and frequent urination. She is losing a fair amount of glucose in her urine but otherwise no indications of ketosis, etc. Does have enough of her insulin at home but appears she needs to regulate her diet first  before we make any significant adjustments of her insulin. It was advised that she would continue to need follow-up with a primary physician. The patient was advised drink plenty of fluids and return here especially develops a fever, chest pain, shortness of breath, or any other new concerns. Concerning her hives, she apparently has had this intermittently for an extensive period of time with a history of urticaria of unknown etiology. She should establish primary care first and then likely referral to a dermatologist. She states Atarax seems to work fairly well for her and she was prescribed Atarax here in emergency department and was also given 50 by mouth Atarax which seemed to cause symptomatic relief. She certainly exhibits no signs of anaphylaxis.  Clinical Course     Assessment:  Hyperglycemia Urticaria     Plan:  Outpatient " Discharge Medication List as of 07/20/2016  1:50 PM    "Prescription Atarax  Patient was advised to return immediately if condition worsens. Patient was advised to follow up with their primary care physician or other specialized physicians involved in their outpatient care. The patient and/or family member/power of attorney had laboratory results reviewed at the bedside. All questions and concerns were addressed and appropriate discharge instructions were distributed by the nursing staff.             Jennye Moccasin, MD 07/20/16 1400

## 2016-07-20 NOTE — ED Notes (Signed)
Went into pt's room to assess, start IV, and draw blood. Pt noted to be sitting on bench with s/o eating breakfast from [employer]. Informed pt that since she was here for hyperglycemia she should probably hold off eating anything else until she could be evaluated by the EDP.

## 2016-07-20 NOTE — ED Notes (Signed)
Respiratory called and informed [facility] is ready for pickup in patient room.

## 2016-07-23 LAB — BLOOD GAS, VENOUS
Acid-Base Excess: 2.9 mmol/L — ABNORMAL HIGH (ref 0.0–2.0)
BICARBONATE: 30.2 mmol/L — AB (ref 20.0–28.0)
PATIENT TEMPERATURE: 37
PCO2 VEN: 56 mmHg (ref 44.0–60.0)
pH, Ven: 7.34 (ref 7.250–7.430)

## 2016-12-08 ENCOUNTER — Other Ambulatory Visit: Payer: Self-pay | Admitting: Student

## 2016-12-08 DIAGNOSIS — M7582 Other shoulder lesions, left shoulder: Secondary | ICD-10-CM

## 2017-01-03 ENCOUNTER — Ambulatory Visit
Admission: RE | Admit: 2017-01-03 | Discharge: 2017-01-03 | Disposition: A | Discharge status: Home / Self Care | Payer: Medicaid Other | Source: Ambulatory Visit | Attending: Student | Admitting: Student

## 2017-01-03 DIAGNOSIS — M75102 Unspecified rotator cuff tear or rupture of left shoulder, not specified as traumatic: Secondary | ICD-10-CM | POA: Insufficient documentation

## 2017-01-03 DIAGNOSIS — M7582 Other shoulder lesions, left shoulder: Secondary | ICD-10-CM | POA: Diagnosis present

## 2017-02-19 ENCOUNTER — Emergency Department
Admission: EM | Admit: 2017-02-19 | Discharge: 2017-02-19 | Disposition: A | Discharge status: Home / Self Care | Payer: Medicaid Other | Attending: Emergency Medicine | Admitting: Emergency Medicine

## 2017-02-19 ENCOUNTER — Encounter: Payer: Self-pay | Admitting: *Deleted

## 2017-02-19 DIAGNOSIS — I10 Essential (primary) hypertension: Secondary | ICD-10-CM | POA: Insufficient documentation

## 2017-02-19 DIAGNOSIS — Z794 Long term (current) use of insulin: Secondary | ICD-10-CM | POA: Insufficient documentation

## 2017-02-19 DIAGNOSIS — R21 Rash and other nonspecific skin eruption: Secondary | ICD-10-CM | POA: Diagnosis present

## 2017-02-19 DIAGNOSIS — L509 Urticaria, unspecified: Secondary | ICD-10-CM | POA: Insufficient documentation

## 2017-02-19 DIAGNOSIS — E119 Type 2 diabetes mellitus without complications: Secondary | ICD-10-CM | POA: Diagnosis not present

## 2017-02-19 DIAGNOSIS — Z79899 Other long term (current) drug therapy: Secondary | ICD-10-CM | POA: Diagnosis not present

## 2017-02-19 MED ORDER — TRIAMCINOLONE ACETONIDE 0.1 % EX CREA: 1.0000 "application " | TOPICAL_CREAM | Freq: Four times a day (QID) | CUTANEOUS | 0 refills | Status: AC

## 2017-02-19 MED ORDER — FAMOTIDINE 20 MG PO TABS
20.0000 mg | ORAL_TABLET | Freq: Once | ORAL | Status: AC
  Administered 2017-02-19: 20 mg via ORAL
  Filled 2017-02-19: qty 1

## 2017-02-19 MED ORDER — METHYLPREDNISOLONE SODIUM SUCC 125 MG IJ SOLR
125.0000 mg | Freq: Once | INTRAMUSCULAR | Status: AC
  Administered 2017-02-19: 125 mg via INTRAMUSCULAR
  Filled 2017-02-19: qty 2

## 2017-02-19 MED ORDER — FAMOTIDINE 20 MG PO TABS: 20.0000 mg | ORAL_TABLET | Freq: Every day | ORAL | 1 refills | Status: AC

## 2017-02-19 MED ORDER — PREDNISONE 50 MG PO TABS: 50.0000 mg | ORAL_TABLET | Freq: Every day | ORAL | 0 refills | Status: AC

## 2017-02-19 MED ORDER — DIPHENHYDRAMINE HCL 50 MG/ML IJ SOLN
50.0000 mg | Freq: Once | INTRAMUSCULAR | Status: AC
  Administered 2017-02-19: 50 mg via INTRAMUSCULAR
  Filled 2017-02-19: qty 1

## 2017-02-19 NOTE — ED Triage Notes (Signed)
Pt has itching rash all over body for 4 days.  Taking benadryl with some relief.  No resp distress.   Pt alert.

## 2017-02-19 NOTE — ED Notes (Signed)

## 2017-02-19 NOTE — ED Provider Notes (Signed)
[facility] Emergency Department Provider Note  ____________________________________________  Time seen: Approximately 4:03 PM  I have reviewed the triage vital signs and the nursing notes.   HISTORY  Chief Complaint Rash    HPI Dana Esparza is a 56 y.o. female who presents to emergency department complaining of hives. Patient reports that over the past 6 years she has had intermittent recurrence of generalized hives. Per the husband, the patient uses significant amounts beauty products as well as multiple different detergents or fabric softeners.patient reports that she has no facial or. Oral involvement. No difficulty breathing. No difficulty swallowing. Patient has tried Benadryl which does improve hives as well as uritis somewhat but then returns. No other complaints at this time.   Past Medical History:  Diagnosis Date  . Arthritis   . Diabetes mellitus without complication (HCC)   . DVT (deep venous thrombosis) (HCC)   . Hepatitis C   . Hypertension   . Migraines   . Neuropathy     Patient Active Problem List   Diagnosis Date Noted  . Obesity (BMI 35.0-39.9 without comorbidity) 01/29/2016  . Hypertension 01/29/2016  . Diabetes (HCC) 12/04/2015  . Hepatitis C 11/20/2015    Past Surgical History:  Procedure Laterality Date  . ABDOMINAL HYSTERECTOMY    . CESAREAN SECTION      Prior to Admission medications   Medication Sig Start Date End Date Taking? Authorizing Provider  baclofen (LIORESAL) 10 MG tablet Take 1 tablet (10 mg total) by mouth 3 (three) times daily. 02/18/16   Triplett, Cari B, FNP  docusate sodium (COLACE) 100 MG capsule Take 100 mg by mouth 2 (two) times daily as needed. 05/08/15   [provider]  famotidine (PEPCID) 20 MG tablet Take 1 tablet (20 mg total) by mouth daily. 02/19/17 09-Mar-2017  Christabella Alvira, Delorise Royals, PA-C  gabapentin (NEURONTIN) 300 MG capsule Take 300 mg by mouth 3 (three) times daily.    [provider]  glipiZIDE (GLUCOTROL XL) 10 MG 24 hr tablet Take 10 mg by mouth daily.    [provider]  HUMULIN 70/30 KWIKPEN (70-30) 100 UNIT/ML PEN INJECT 100 UNITS UNDER THE SKIN 2 TIMES A DAY. MAX DAILY DOSE IS 200 UNITS. 04/01/16   Michiel Cowboy A, PA-C  hydrOXYzine (ATARAX/VISTARIL) 25 MG tablet Take 1 tablet (25 mg total) by mouth 3 (three) times daily as needed. 07/20/16   Jennye Moccasin, MD  hydrOXYzine (VISTARIL) 25 MG capsule Take 1 capsule (25 mg total) by mouth 3 (three) times daily as needed. 01/18/16   Beers, Charmayne Sheer, PA-C  Insulin NPH Isophane & Regular (HUMULIN 70/30 KWIKPEN [place]) Inject 50 Units into the skin 2 (two) times daily. Inject 50 units under the skin twice daily.  Increase dose by 5 units every week until AM sugar is less than 100.  Max daily dose - 100 units twice daily.    [provider]  Liraglutide (VICTOZA [place]) Inject 0.6 mg into the skin daily. Start with 0.6 mg daily for 1 week then 1.2 mg daily for 1 week, then 1.8 mg daily. 12/05/15   [provider]  loratadine (CLARITIN) 10 MG tablet Take 1 tablet (10 mg total) by mouth daily. 10/11/15 10/10/16  Tommi Rumps, PA-C  meloxicam (MOBIC) 15 MG tablet Take 1 tablet (15 mg total) by mouth daily. 02/18/16   Kem Boroughs B, FNP  methylPREDNISolone (MEDROL DOSEPAK) 4 MG TBPK tablet Take Tapered dose as directed 06/23/16   Nona Dell  K, PA-C  omeprazole (PRILOSEC) 10 MG capsule Take 20 mg by mouth daily.    [provider]  omeprazole (PRILOSEC) 20 MG capsule TAKE ONE CAPSULE BY MOUTH EVERY DAY. REPLACES NEXIUM. 05/27/16   Zachery Dauer, FNP  predniSONE (DELTASONE) 50 MG tablet Take 1 tablet (50 mg total) by mouth daily with breakfast. 02/19/17   Adeline Petitfrere, Delorise Royals, PA-C  ranitidine (ZANTAC) 150 MG tablet Take 1 tablet (150 mg total) by mouth 2 (two) times daily. 10/11/15 10/10/16  Tommi Rumps, PA-C  triamcinolone cream (KENALOG) 0.1 % Apply 1 application topically 4  (four) times daily. 02/19/17   Partick Musselman, Delorise Royals, PA-C  verapamil (CALAN) 80 MG tablet Take 1 tablet (80 mg total) by mouth 3 (three) times daily. 01/18/16 01/17/17  Beers, Charmayne Sheer, PA-C    Allergies Orange fruit [citrus]; Peach [prunus persica]; Percocet [oxycodone-acetaminophen]; and Strawberry flavor  Family History  Problem Relation Age of Onset  . Hypertension Mother   . Arthritis Mother   . Diabetes Mother   . Cataracts Mother   . Diabetes Father   . Arthritis Father   . Alzheimer's disease Father   . Diabetes Daughter   . Diabetes Maternal Grandmother   . Diabetes Paternal Grandmother     Social History Social History  Substance Use Topics  . Smoking status: Never Smoker  . Smokeless tobacco: Never Used  . Alcohol use Yes     Comment: 12oz every other day     Review of Systems  Constitutional: No fever/chills Eyes: No visual changes. Cardiovascular: no chest pain. Respiratory: no cough. No SOB. Gastrointestinal: No abdominal pain.  No nausea, no vomiting.   Musculoskeletal: Negative for musculoskeletal pain. Skin: Npositive for scattered hives over bilateral extremities, upper and lower, as well as torso. Neurological: Negative for headaches, focal weakness or numbness. 10-point ROS otherwise negative.  ____________________________________________   PHYSICAL EXAM:  VITAL SIGNS: ED Triage Vitals  Enc Vitals Group     BP 02/19/17 1514 (!) 150/100     Pulse Rate 02/19/17 1514 98     Resp 02/19/17 1514 20     Temp 02/19/17 1514 98.9 F (37.2 C)     Temp Source 02/19/17 1514 Oral     SpO2 02/19/17 1514 99 %     Weight 02/19/17 1515 163 lb (73.9 kg)     Height 02/19/17 1515 5\' 5"  (1.651 m)     Head Circumference --      Peak Flow --      Pain Score --      Pain Loc --      Pain Edu? --      Excl. in GC? --      Constitutional: Alert and oriented. Well appearing and in no acute distress. Eyes: Conjunctivae are normal. PERRL. EOMI. Head:  Atraumatic. ENT:      Ears:       Nose: No congestion/rhinnorhea.      Mouth/Throat: Mucous membranes are moist. No angioedema. Neck: No stridor.    Cardiovascular: Normal rate, regular rhythm. Normal S1 and S2.  Good peripheral circulation. Respiratory: Normal respiratory effort without tachypnea or retractions. Lungs CTAB. Good air entry to the bases with no decreased or absent breath sounds. Musculoskeletal: Full range of motion to all extremities. No gross deformities appreciated. Neurologic:  Normal speech and language. No gross focal neurologic deficits are appreciated.  Skin:  Skin is warm, dry and intact. No rash noted.scattered hives over bilateral upper and lower extremities. Few  scattered hives over the torso. No facial, perioral, periocular involvement. Psychiatric: Mood and affect are normal. Speech and behavior are normal. Patient exhibits appropriate insight and judgement.   ____________________________________________   LABS (all labs ordered are listed, but only abnormal results are displayed)  Labs Reviewed - No data to display ____________________________________________  EKG   ____________________________________________  RADIOLOGY   No results found.  ____________________________________________    PROCEDURES  Procedure(s) performed:    Procedures    Medications  methylPREDNISolone sodium succinate (SOLU-MEDROL) 125 mg/2 mL injection 125 mg (125 mg Intramuscular Given 02/19/17 1626)  diphenhydrAMINE (BENADRYL) injection 50 mg (50 mg Intramuscular Given 02/19/17 1626)  famotidine (PEPCID) tablet 20 mg (20 mg Oral Given 02/19/17 1626)     ____________________________________________   INITIAL IMPRESSION / ASSESSMENT AND PLAN / ED COURSE  Pertinent labs & imaging results that were available during my care of the patient were reviewed by me and considered in my medical decision making (see chart for details).  Review of the [place] CSRS was  performed in accordance of the [facility] prior to dispensing any controlled drugs.     Patient's diagnosis is consistent with eyes. Patient uses multiple beauty products as well as multiple laundry detergents and pharynx all fingers. Patient has had off and on symptoms for several years. He should symptoms did improve somewhat with Benadryl but did not fully resolve. At this time, patient will be treated with prednisone, diphenhydramine, famotidine.. Patient will be discharged home with prescriptions for prednisone and famotidine. She is to continue diphenhydramine at home.. Patient is to follow up with primary care as needed or otherwise directed. Patient is given ED precautions to return to the ED for any worsening or new symptoms.     ____________________________________________  FINAL CLINICAL IMPRESSION(S) / ED DIAGNOSES  Final diagnoses:  Hives      NEW MEDICATIONS STARTED DURING THIS VISIT:  Discharge Medication List as of 02/19/2017  4:29 PM    START taking these medications   Details  famotidine (PEPCID) 20 MG tablet Take 1 tablet (20 mg total) by mouth daily., Starting Thu 02/19/2017, Until Fri July 27, 2017, Print    predniSONE (DELTASONE) 50 MG tablet Take 1 tablet (50 mg total) by mouth daily with breakfast., Starting Thu 02/19/2017, Print    triamcinolone cream (KENALOG) 0.1 % Apply 1 application topically 4 (four) times daily., Starting Thu 02/19/2017, Print            This chart was dictated using voice recognition software/Dragon. Despite best efforts to proofread, errors can occur which can change the meaning. Any change was purely unintentional.    Racheal PatchesCuthriell, Keidrick Murty D, PA-C 02/19/17 1653    Phineas SemenGoodman, Graydon, MD 02/19/17 1945

## 2017-02-28 ENCOUNTER — Encounter: Payer: Self-pay | Admitting: Emergency Medicine

## 2017-02-28 ENCOUNTER — Emergency Department
Admission: EM | Admit: 2017-02-28 | Discharge: 2017-02-28 | Disposition: A | Discharge status: Home / Self Care | Payer: Medicaid Other | Attending: Emergency Medicine | Admitting: Emergency Medicine

## 2017-02-28 DIAGNOSIS — Z79899 Other long term (current) drug therapy: Secondary | ICD-10-CM | POA: Diagnosis not present

## 2017-02-28 DIAGNOSIS — Z794 Long term (current) use of insulin: Secondary | ICD-10-CM | POA: Insufficient documentation

## 2017-02-28 DIAGNOSIS — E119 Type 2 diabetes mellitus without complications: Secondary | ICD-10-CM | POA: Diagnosis not present

## 2017-02-28 DIAGNOSIS — I10 Essential (primary) hypertension: Secondary | ICD-10-CM | POA: Diagnosis not present

## 2017-02-28 DIAGNOSIS — M5431 Sciatica, right side: Secondary | ICD-10-CM | POA: Insufficient documentation

## 2017-02-28 DIAGNOSIS — M545 Low back pain: Secondary | ICD-10-CM | POA: Diagnosis present

## 2017-02-28 MED ORDER — TRAMADOL HCL 50 MG PO TABS
50.0000 mg | ORAL_TABLET | Freq: Two times a day (BID) | ORAL | 0 refills | Status: AC | PRN
Start: 2017-02-28 — End: ?

## 2017-02-28 MED ORDER — ORPHENADRINE CITRATE 30 MG/ML IJ SOLN
60.0000 mg | Freq: Two times a day (BID) | INTRAMUSCULAR | Status: DC
  Administered 2017-02-28: 60 mg via INTRAMUSCULAR
  Filled 2017-02-28: qty 2

## 2017-02-28 MED ORDER — HYDROMORPHONE HCL 1 MG/ML IJ SOLN
1.0000 mg | Freq: Once | INTRAMUSCULAR | Status: AC
  Administered 2017-02-28: 1 mg via INTRAMUSCULAR
  Filled 2017-02-28: qty 1

## 2017-02-28 MED ORDER — METHOCARBAMOL 750 MG PO TABS: 1500.0000 mg | ORAL_TABLET | Freq: Four times a day (QID) | ORAL | 0 refills | Status: AC

## 2017-02-28 NOTE — ED Notes (Signed)
See triage note  States she developed lower back pain yesterday denies any injury   States pain radiates into right leg  Ambulates with slight limp

## 2017-02-28 NOTE — ED Triage Notes (Signed)
Pt c/o right lower back pain starting last night. Has been walking a lot per pt but has not fallen. Pain goes down right leg with some tingling at times. Pain worse when sitting and laying flat.  No loss bowel or bladder. Ambulatory to triage without difficulty.

## 2017-02-28 NOTE — ED Provider Notes (Signed)
[facility] Emergency Department Provider Note   ____________________________________________   First MD Initiated Contact with Patient 02/28/17 1056     (approximate)  I have reviewed the triage vital signs and the nursing notes.   HISTORY  Chief Complaint Back Pain    HPI Dana Esparza is a 56 y.o. female patient complaining of radicular low back pain to the right lower extremity. Patient state increased physical training this is a walking in the past week. Patient stated there is a tingling sensation that radiates from her left buttocks to mid calf. Patient denies any bladder or bowel dysfunction. Patient is able to ambulate. No palliative measures taken for this complaint.patient rates her discomfort as a 9/10.   Past Medical History:  Diagnosis Date  . Arthritis   . Diabetes mellitus without complication (HCC)   . DVT (deep venous thrombosis) (HCC)   . Hepatitis C   . Hypertension   . Migraines   . Neuropathy     Patient Active Problem List   Diagnosis Date Noted  . Obesity (BMI 35.0-39.9 without comorbidity) 01/29/2016  . Hypertension 01/29/2016  . Diabetes (HCC) 12/04/2015  . Hepatitis C 11/20/2015    Past Surgical History:  Procedure Laterality Date  . ABDOMINAL HYSTERECTOMY    . CESAREAN SECTION      Prior to Admission medications   Medication Sig Start Date End Date Taking? Authorizing Provider  baclofen (LIORESAL) 10 MG tablet Take 1 tablet (10 mg total) by mouth 3 (three) times daily. 02/18/16   Triplett, Cari B, FNP  docusate sodium (COLACE) 100 MG capsule Take 100 mg by mouth 2 (two) times daily as needed. 05/08/15   [provider]  famotidine (PEPCID) 20 MG tablet Take 1 tablet (20 mg total) by mouth daily. 02/19/17 Jul 04, 2017  Cuthriell, Delorise RoyalsJonathan D, PA-C  gabapentin (NEURONTIN) 300 MG capsule Take 300 mg by mouth 3 (three) times daily.    [provider]  glipiZIDE (GLUCOTROL XL) 10 MG 24 hr tablet Take  10 mg by mouth daily.    [provider]  HUMULIN 70/30 KWIKPEN (70-30) 100 UNIT/ML PEN INJECT 100 UNITS UNDER THE SKIN 2 TIMES A DAY. MAX DAILY DOSE IS 200 UNITS. 04/01/16   Michiel CowboyMcGowan, Shannon A, PA-C  hydrOXYzine (ATARAX/VISTARIL) 25 MG tablet Take 1 tablet (25 mg total) by mouth 3 (three) times daily as needed. 07/20/16   Jennye MoccasinQuigley, Brian S, MD  hydrOXYzine (VISTARIL) 25 MG capsule Take 1 capsule (25 mg total) by mouth 3 (three) times daily as needed. 01/18/16   Beers, Charmayne Sheerharles M, PA-C  Insulin NPH Isophane & Regular (HUMULIN 70/30 KWIKPEN [place]) Inject 50 Units into the skin 2 (two) times daily. Inject 50 units under the skin twice daily.  Increase dose by 5 units every week until AM sugar is less than 100.  Max daily dose - 100 units twice daily.    [provider]  Liraglutide (VICTOZA [place]) Inject 0.6 mg into the skin daily. Start with 0.6 mg daily for 1 week then 1.2 mg daily for 1 week, then 1.8 mg daily. 12/05/15   [provider]  loratadine (CLARITIN) 10 MG tablet Take 1 tablet (10 mg total) by mouth daily. 10/11/15 10/10/16  Tommi RumpsSummers, Rhonda L, PA-C  meloxicam (MOBIC) 15 MG tablet Take 1 tablet (15 mg total) by mouth daily. 02/18/16   Triplett, Rulon Eisenmengerari B, FNP  methocarbamol (ROBAXIN-750) 750 MG tablet Take 2 tablets (1,500 mg total) by mouth 4 (four) times daily. 02/28/17  Joni Reining, PA-C  methylPREDNISolone (MEDROL DOSEPAK) 4 MG TBPK tablet Take Tapered dose as directed 06/23/16   Joni Reining, PA-C  omeprazole (PRILOSEC) 10 MG capsule Take 20 mg by mouth daily.    [provider]  omeprazole (PRILOSEC) 20 MG capsule TAKE ONE CAPSULE BY MOUTH EVERY DAY. REPLACES NEXIUM. 05/27/16   Zachery Dauer, FNP  predniSONE (DELTASONE) 50 MG tablet Take 1 tablet (50 mg total) by mouth daily with breakfast. 02/19/17   Cuthriell, Delorise Royals, PA-C  ranitidine (ZANTAC) 150 MG tablet Take 1 tablet (150 mg total) by mouth 2 (two) times daily. 10/11/15 10/10/16  Tommi Rumps, PA-C    traMADol (ULTRAM) 50 MG tablet Take 1 tablet (50 mg total) by mouth every 12 (twelve) hours as needed. 02/28/17   Joni Reining, PA-C  triamcinolone cream (KENALOG) 0.1 % Apply 1 application topically 4 (four) times daily. 02/19/17   Cuthriell, Delorise Royals, PA-C  verapamil (CALAN) 80 MG tablet Take 1 tablet (80 mg total) by mouth 3 (three) times daily. 01/18/16 01/17/17  Beers, Charmayne Sheer, PA-C    Allergies Orange fruit [citrus]; Peach [prunus persica]; Percocet [oxycodone-acetaminophen]; and Strawberry flavor  Family History  Problem Relation Age of Onset  . Hypertension Mother   . Arthritis Mother   . Diabetes Mother   . Cataracts Mother   . Diabetes Father   . Arthritis Father   . Alzheimer's disease Father   . Diabetes Daughter   . Diabetes Maternal Grandmother   . Diabetes Paternal Grandmother     Social History Social History  Substance Use Topics  . Smoking status: Never Smoker  . Smokeless tobacco: Never Used  . Alcohol use Yes     Comment: 12oz every other day    Review of Systems  Constitutional: No fever/chills Eyes: No visual changes. ENT: No sore throat. Cardiovascular: Denies chest pain. Respiratory: Denies shortness of breath. Gastrointestinal: No abdominal pain.  No nausea, no vomiting.  No diarrhea.  No constipation. Genitourinary: Negative for dysuria. Musculoskeletal: Negative for back pain. Skin: Negative for rash. Neurological: Negative for headaches,  intermitting paresthesia right leg. Endocrine:diabetes and hypertension. Hematological/Lymphatic:hepatitis C Allergic/Immunilogical:see medication list ____________________________________________   PHYSICAL EXAM:  VITAL SIGNS: ED Triage Vitals  Enc Vitals Group     BP 02/28/17 1020 (!) 138/91     Pulse Rate 02/28/17 1019 94     Resp 02/28/17 1019 18     Temp 02/28/17 1019 98.3 F (36.8 C)     Temp Source 02/28/17 1019 Oral     SpO2 02/28/17 1019 100 %     Weight 02/28/17 1019 171 lb  (77.6 kg)     Height 02/28/17 1019 5\' 5"  (1.651 m)     Head Circumference --      Peak Flow --      Pain Score 02/28/17 1019 9     Pain Loc --      Pain Edu? --      Excl. in GC? --     Constitutional: Alert and oriented. Well appearing and in no acute distress. Cardiovascular: Normal rate, regular rhythm. Grossly normal heart sounds.  Good peripheral circulation. Respiratory: Normal respiratory effort.  No retractions. Lungs CTAB. Gastrointestinal: Soft and nontender. No distention. No abdominal bruits. No CVA tenderness. Musculoskeletal: no obvious spinal deformity No guarding palpation of the spinal processes. Moderate guarding palpation posteriorly. Right paraspinal muscle spasm. Negative straight leg test.  No lower extremity tenderness nor edema.  Neurologic:  Normal  speech and language. No gross focal neurologic deficits are appreciated. No gait instability. Skin:  Skin is warm, dry and intact. No rash noted. Psychiatric: Mood and affect are normal. Speech and behavior are normal.  ____________________________________________   LABS (all labs ordered are listed, but only abnormal results are displayed)  Labs Reviewed - No data to display ____________________________________________  EKG   ____________________________________________  RADIOLOGY   ____________________________________________   PROCEDURES  Procedure(s) performed: None  Procedures  Critical Care performed: No  ____________________________________________   INITIAL IMPRESSION / ASSESSMENT AND PLAN / ED COURSE  Pertinent labs & imaging results that were available during my care of the patient were reviewed by me and considered in my medical decision making (see chart for details).  Radicular right back pain. Patient given discharge care instructions. Patient given a prescription for Robaxin and tramadol. Patient advised to follow-up with family doctor for continual care. Return to ER for  condition worsens.      ____________________________________________   FINAL CLINICAL IMPRESSION(S) / ED DIAGNOSES  Final diagnoses:  Sciatica of right side      NEW MEDICATIONS STARTED DURING THIS VISIT:  New Prescriptions   METHOCARBAMOL (ROBAXIN-750) 750 MG TABLET    Take 2 tablets (1,500 mg total) by mouth 4 (four) times daily.   TRAMADOL (ULTRAM) 50 MG TABLET    Take 1 tablet (50 mg total) by mouth every 12 (twelve) hours as needed.     Note:  This document was prepared using Dragon voice recognition software and may include unintentional dictation errors.    Joni Reining, PA-C 02/28/17 1121    Governor Rooks, MD 02/28/17 [phone]

## 2017-03-06 ENCOUNTER — Emergency Department
Admission: EM | Admit: 2017-03-06 | Discharge: 2017-03-13 | Disposition: E | Payer: Medicaid Other | Attending: Emergency Medicine | Admitting: Emergency Medicine

## 2017-03-06 ENCOUNTER — Emergency Department: Payer: Medicaid Other

## 2017-03-06 ENCOUNTER — Encounter: Payer: Self-pay | Admitting: Emergency Medicine

## 2017-03-06 DIAGNOSIS — I10 Essential (primary) hypertension: Secondary | ICD-10-CM | POA: Diagnosis not present

## 2017-03-06 DIAGNOSIS — R0603 Acute respiratory distress: Secondary | ICD-10-CM | POA: Diagnosis present

## 2017-03-06 DIAGNOSIS — Z7984 Long term (current) use of oral hypoglycemic drugs: Secondary | ICD-10-CM | POA: Insufficient documentation

## 2017-03-06 DIAGNOSIS — Z79899 Other long term (current) drug therapy: Secondary | ICD-10-CM | POA: Insufficient documentation

## 2017-03-06 DIAGNOSIS — E119 Type 2 diabetes mellitus without complications: Secondary | ICD-10-CM | POA: Diagnosis not present

## 2017-03-06 DIAGNOSIS — I469 Cardiac arrest, cause unspecified: Secondary | ICD-10-CM | POA: Insufficient documentation

## 2017-03-06 LAB — BLOOD GAS, VENOUS
PATIENT TEMPERATURE: 37
pCO2, Ven: 49 mmHg (ref 44.0–60.0)
pH, Ven: <6.9 — CL (ref 7.250–7.430)
pO2, Ven: 48 mmHg — ABNORMAL HIGH (ref 32.0–45.0)

## 2017-03-06 LAB — GLUCOSE, CAPILLARY: GLUCOSE-CAPILLARY: 275 mg/dL — AB (ref 65–99)

## 2017-03-06 MED ORDER — VANCOMYCIN HCL IN DEXTROSE 1-5 GM/200ML-% IV SOLN
INTRAVENOUS | Status: AC
  Filled 2017-03-06: qty 200

## 2017-03-06 MED ORDER — PIPERACILLIN-TAZOBACTAM 3.375 G IVPB
INTRAVENOUS | Status: AC
  Administered 2017-03-06: 3.375 g
  Filled 2017-03-06: qty 50

## 2017-03-13 NOTE — ED Notes (Signed)
Body taken to morgue by orderly Melissa.

## 2017-03-13 NOTE — ED Notes (Addendum)
At 1245, this nurse, RN Jeannett SeniorStephen, and Dr. Derrill KayGoodman accompanied pt to CT room 1 for scan. Pt was transported with [employer], as well as small unit from [facility] monitor. Pt was unresponsive, and bp was 45/23, although the reliability of the reading is in question. No manual pressure was able to be auscultated. Upon arrival to CT, the pt was placed onto the table from the stretcher. As she was being strapped in for safety, she began to vomit. Suction was set up and performed. Pt was intubated. Pt's hr began to fall, falling to 0. CPR was started; documentation of medications and times is on separate code sheet. Pt pronounced dead at 1320.   Pt's family was notified in family room by Dr. Derrill KayGoodman and pt was moved to room 30, the body prepared, and family allowed to visit in the presence of Alfredia ClientMary Jo, patient relations.

## 2017-03-13 NOTE — ED Provider Notes (Signed)
[facility] Emergency Department Provider Note  Please note charting was delayed given emergent care. ____________________________________________   I have reviewed the triage vital signs and the nursing notes.   HISTORY  Chief Complaint Respiratory Distress   History limited by: Altered Mental Status   HPI Dana Esparza is a 56 y.o. female who presents to the emergency department today via EMS as emergency traffic for altered mental status. It is somewhat unclear how long she has been altered. Apparently it was family called EMS today. EMS stated they do not think the patient has had her insulin in roughly 3 days. History is unable to be obtained from the patient herself.   Past Medical History:  Diagnosis Date  . Arthritis   . Diabetes mellitus without complication (HCC)   . DVT (deep venous thrombosis) (HCC)   . Hepatitis C   . Hypertension   . Migraines   . Neuropathy     Patient Active Problem List   Diagnosis Date Noted  . Obesity (BMI 35.0-39.9 without comorbidity) 01/29/2016  . Hypertension 01/29/2016  . Diabetes (HCC) 12/04/2015  . Hepatitis C 11/20/2015    Past Surgical History:  Procedure Laterality Date  . ABDOMINAL HYSTERECTOMY    . CESAREAN SECTION      Prior to Admission medications   Medication Sig Start Date End Date Taking? Authorizing Provider  amLODipine (NORVASC) 5 MG tablet Take 5 mg by mouth daily.   Yes [provider]  diazepam (VALIUM) 5 MG tablet Take 5 mg by mouth every 8 (eight) hours as needed for muscle spasms.   Yes [provider]  gabapentin (NEURONTIN) 300 MG capsule Take 300 mg by mouth 3 (three) times daily.   Yes [provider]  ibuprofen (ADVIL,MOTRIN) 600 MG tablet Take 600 mg by mouth every 6 (six) hours as needed.   Yes [provider]  Liraglutide (VICTOZA [place]) Inject 0.6 mg into the skin daily. Start with 0.6 mg daily for 1 week then 1.2 mg daily for 1 week,  then 1.8 mg daily. 12/05/15  Yes [provider]  methocarbamol (ROBAXIN-750) 750 MG tablet Take 2 tablets (1,500 mg total) by mouth 4 (four) times daily. 02/28/17  Yes Joni Reining, PA-C  omeprazole (PRILOSEC) 20 MG capsule TAKE ONE CAPSULE BY MOUTH EVERY DAY. REPLACES NEXIUM. 05/27/16  Yes Odem, Deeann Cree, FNP  traMADol (ULTRAM) 50 MG tablet Take 1 tablet (50 mg total) by mouth every 12 (twelve) hours as needed. 02/28/17  Yes Joni Reining, PA-C  baclofen (LIORESAL) 10 MG tablet Take 1 tablet (10 mg total) by mouth 3 (three) times daily. 02/18/16   Triplett, Cari B, FNP  docusate sodium (COLACE) 100 MG capsule Take 100 mg by mouth 2 (two) times daily as needed. 05/08/15   [provider]  famotidine (PEPCID) 20 MG tablet Take 1 tablet (20 mg total) by mouth daily. 02/19/17 2017/03/10  Cuthriell, Delorise Royals, PA-C  glipiZIDE (GLUCOTROL XL) 10 MG 24 hr tablet Take 10 mg by mouth daily.    [provider]  HUMULIN 70/30 KWIKPEN (70-30) 100 UNIT/ML PEN INJECT 100 UNITS UNDER THE SKIN 2 TIMES A DAY. MAX DAILY DOSE IS 200 UNITS. 04/01/16   Michiel Cowboy A, PA-C  hydrOXYzine (ATARAX/VISTARIL) 25 MG tablet Take 1 tablet (25 mg total) by mouth 3 (three) times daily as needed. 07/20/16   Jennye Moccasin, MD  hydrOXYzine (VISTARIL) 25 MG capsule Take 1 capsule (25 mg total) by mouth 3 (three)  times daily as needed. 01/18/16   Beers, Charmayne Sheerharles M, PA-C  Insulin NPH Isophane & Regular (HUMULIN 70/30 KWIKPEN [place]) Inject 50 Units into the skin 2 (two) times daily. Inject 50 units under the skin twice daily.  Increase dose by 5 units every week until AM sugar is less than 100.  Max daily dose - 100 units twice daily.    [provider]  loratadine (CLARITIN) 10 MG tablet Take 1 tablet (10 mg total) by mouth daily. 10/11/15 10/10/16  Tommi RumpsSummers, Rhonda L, PA-C  meloxicam (MOBIC) 15 MG tablet Take 1 tablet (15 mg total) by mouth daily. 02/18/16   Kem Boroughsriplett, Cari B, FNP  methylPREDNISolone (MEDROL  DOSEPAK) 4 MG TBPK tablet Take Tapered dose as directed 06/23/16   Joni ReiningSmith, Ronald K, PA-C  predniSONE (DELTASONE) 50 MG tablet Take 1 tablet (50 mg total) by mouth daily with breakfast. 02/19/17   Cuthriell, Delorise RoyalsJonathan D, PA-C  ranitidine (ZANTAC) 150 MG tablet Take 1 tablet (150 mg total) by mouth 2 (two) times daily. 10/11/15 10/10/16  Tommi RumpsSummers, Rhonda L, PA-C  triamcinolone cream (KENALOG) 0.1 % Apply 1 application topically 4 (four) times daily. 02/19/17   Cuthriell, Delorise RoyalsJonathan D, PA-C  verapamil (CALAN) 80 MG tablet Take 1 tablet (80 mg total) by mouth 3 (three) times daily. 01/18/16 01/17/17  Beers, Charmayne Sheerharles M, PA-C    Allergies Orange fruit [citrus]; Peach [prunus persica]; Percocet [oxycodone-acetaminophen]; and Strawberry flavor  Family History  Problem Relation Age of Onset  . Hypertension Mother   . Arthritis Mother   . Diabetes Mother   . Cataracts Mother   . Diabetes Father   . Arthritis Father   . Alzheimer's disease Father   . Diabetes Daughter   . Diabetes Maternal Grandmother   . Diabetes Paternal Grandmother     Social History Social History  Substance Use Topics  . Smoking status: Never Smoker  . Smokeless tobacco: Never Used  . Alcohol use Yes     Comment: 12oz every other day    Review of Systems Unable to obtain secondary to AMS  ____________________________________________   PHYSICAL EXAM:  VITAL SIGNS: ED Triage Vitals  Enc Vitals Group     BP --      Pulse Rate August 30, 2017 1233 61     Resp August 30, 2017 1233 (!) 36     Temp August 30, 2017 1239 100.1 F (37.8 C)     Temp Source August 30, 2017 1239 Rectal     SpO2 August 30, 2017 1233 100 %     Weight August 30, 2017 1235 170 lb (77.1 kg)     Height August 30, 2017 1235 5\' 5"  (1.651 m)   Constitutional: Awake, tracks slightly. Non verbal.  Eyes: Conjunctivae are normal.  ENT   Head: Normocephalic and atraumatic.   Nose: No congestion/rhinnorhea.   Mouth/Throat: Mucous membranes are moist.   Neck: No  stridor. Hematological/Lymphatic/Immunilogical: No cervical lymphadenopathy. Cardiovascular: Tachycardic, regular rhythm.  No murmurs, rubs, or gallops.  Respiratory: Increased respiratory effort. Increased rate. Nasal cannula in place.  Gastrointestinal: Soft and non tender. No rebound. No guarding.  Genitourinary: Deferred Musculoskeletal: No deformity Neurologic:  Non verbal. Awake. Tracking. Responds to painful stimuli.  Skin:  Skin is warm, dry and intact.  ____________________________________________    LABS (pertinent positives/negatives)  pH <6.9  ____________________________________________   EKG  None  ____________________________________________    RADIOLOGY  None  ____________________________________________   PROCEDURES  Procedures  CRITICAL CARE Performed by: Phineas SemenGOODMAN, Dragon Thrush   Total critical care time: 55 minutes  Critical care time was exclusive  of separately billable procedures and treating other patients.  Critical care was necessary to treat or prevent imminent or life-threatening deterioration.  Critical care was time spent personally by me on the following activities: development of treatment plan with patient and/or surrogate as well as nursing, discussions with consultants, evaluation of patient's response to treatment, examination of patient, obtaining history from patient or surrogate, ordering and performing treatments and interventions, ordering and review of laboratory studies, ordering and review of radiographic studies, pulse oximetry and re-evaluation of patient's condition.  INTUBATION Performed by: Phineas Semen  Indications: cardiopulmonary arrest  Intubation method: Glidescope   Preoxygenation: BVM  Sedatives: None Paralytic: None  Tube Size: 7.5 cuffed  Post-procedure assessment: chest rise and ETCO2 monitor Breath sounds: equal and absent over the epigastrium Tube secured with: ETT holder Chest x-ray interpreted  by radiologist and me.  Chest x-ray findings: endotracheal tube in appropriate position  Patient tolerated the procedure well with no immediate complications.  Cardiopulmonary Resuscitation (CPR) Procedure Note Directed/Performed by: Phineas Semen I personally directed ancillary staff and/or performed CPR in an effort to regain return of spontaneous circulation and to maintain cardiac, neuro and systemic perfusion.     ____________________________________________   INITIAL IMPRESSION / ASSESSMENT AND PLAN / ED COURSE  Pertinent labs & imaging results that were available during my care of the patient were reviewed by me and considered in my medical decision making (see chart for details).  Patient arrived to the emergency department today via EMS as emergency traffic. Patient was significantly altered upon arrival here in the emergency department. She was however awake and was tracking. She was breathing on her own. The patient was unable to give any history. Patient was noted to have a rectal temperature of 100.1. This along with the tachycardia raised suspicion for sepsis. Broad-spectrum antibiotics were ordered and started. Additionally patient was started on multiple IV fluid boluses. Given altered mental status I did have concern for intracranial process. The patient was transported to the CT scanner. I accompanied the patient during transport. While the CT techs were hoping to secure the patient to the CT table I noticed that her heart rate started slowing and the patient became significantly bradycardic. Additionally the patient stopped breathing. The patient then quickly lost pulses. CPR was initiated. Multiple rounds of CPR and multiple medications were attempted to revive the patient. Unfortunately after multiple rounds the patient was asystolic with no pulses. After multiple failed attempts to revive the patient, the patient was pronounced dead. During CPR I did intubate the patient  via glidescope. Please see nursing note for full code blue timeline and medications. Medical examiner's office and [place] police were contacted. ____________________________________________   FINAL CLINICAL IMPRESSION(S) / ED DIAGNOSES  Final diagnoses:  Cardiopulmonary arrest [facility])     Note: This dictation was prepared with Dragon dictation. Any transcriptional errors that result from this process are unintentional     Phineas Semen, MD 13-Mar-2017 [phone]

## 2017-03-13 NOTE — ED Triage Notes (Signed)
Pt via ems from home with shallow/agnal breathing; arrived with mask ventilation. Pt sat up and became unresponsive when EMS arrived. Family states she has been acting "funny" for a few days.

## 2017-03-13 NOTE — Progress Notes (Signed)
CH responded to a page for a Pt that coded in CT and expired. CH provided emotion support for the husband, son and many other family members. CH made a follow up with the deceased's family and provided kleenex and spiritual support. CH also asked nurse to provide the family with water, sodas and juice. [facility]     25-Apr-2017 1700  Clinical Encounter Type  Visited With Patient;Patient and family together  Visit Type Initial;Follow-up;Spiritual support;Social support;Code;Death;Trauma  Referral From Nurse  Consult/Referral To Chaplain  Spiritual Encounters  Spiritual Needs Prayer;Emotional;Grief support

## 2017-03-13 NOTE — ED Notes (Signed)
Medications delivered to pharmacy.

## 2017-03-13 NOTE — ED Notes (Signed)
Called code sepsis to [facility] [phone], per RN

## 2017-03-13 NOTE — ED Notes (Signed)
Notified Diplomatic Services operational officersecretary to call code sepsis. Pt given fluids with pressure bag. Verbal order from Dr. Derrill KayGoodman for zosyn and vanc.

## 2017-03-13 DEATH — deceased

## 2017-11-12 IMAGING — MR MR SHOULDER*L* W/O CM
5 series · 40 of 40 positions shown · non-contrast
Comparison: Radiographs 12/26/2014.

CLINICAL DATA: 55-year-old with left shoulder pain radiating into
the neck. Difficulty abducting the forearm. Symptoms for 5 years,
worsening over the last few months. No known injury or prior
relevant surgery.

EXAM:
MRI OF THE LEFT SHOULDER WITHOUT CONTRAST
TECHNIQUE: Multiplanar, multisequence MR imaging of the shoulder was performed.
No intravenous contrast was administered.

[Series 4: T2 fat-sat · axial · 4.0mm · 0.47mm/px · z∈[-5,+83]mm · 8 of 21 slices shown (1 of 3)]
[im 1/21]
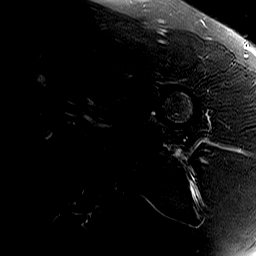
[im 3/21]
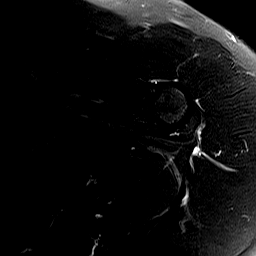
[im 6/21]
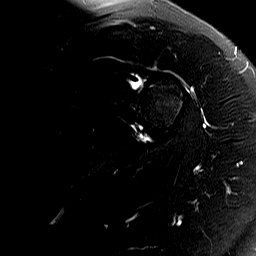
[im 9/21]
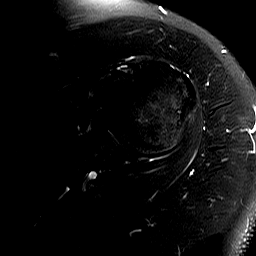
[im 12/21]
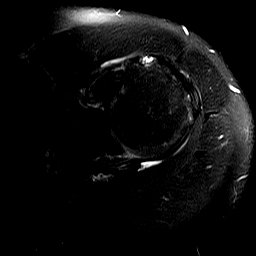
[im 15/21]
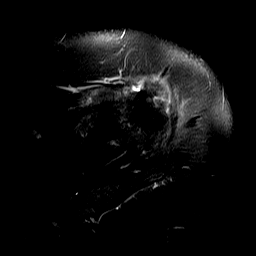
[im 18/21]
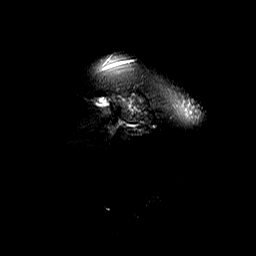
[im 21/21]
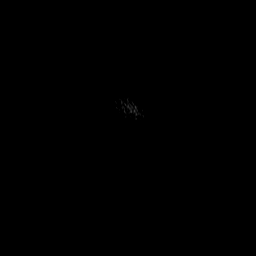

[Series 5: T2 fat-sat · coronal · 4.0mm · 0.62mm/px · 8 of 21 slices shown (2 of 3)]
[im 1/21]
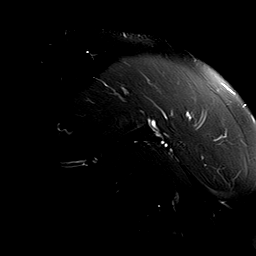
[im 3/21]
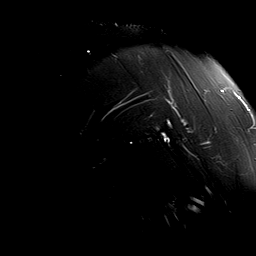
[im 6/21]
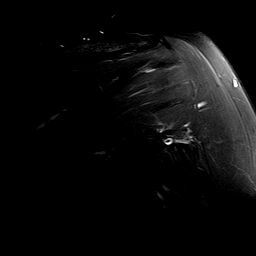
[im 9/21]
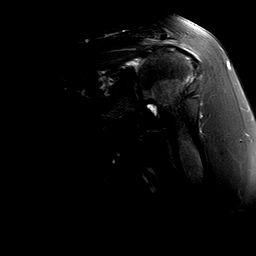
[im 12/21]
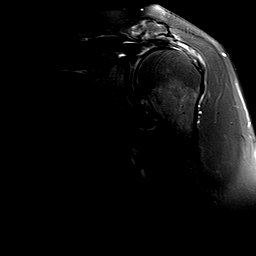
[im 15/21]
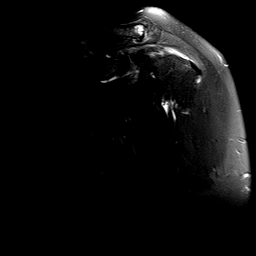
[im 18/21]
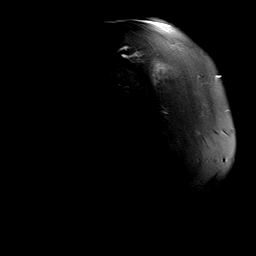
[im 21/21]
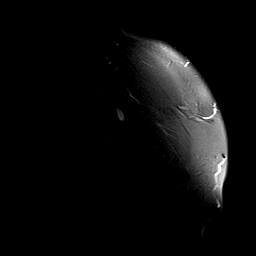

[Series 6: PD · coronal · 4.0mm · 0.62mm/px · 8 of 21 slices shown]
[im 1/21]
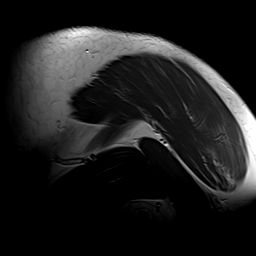
[im 3/21]
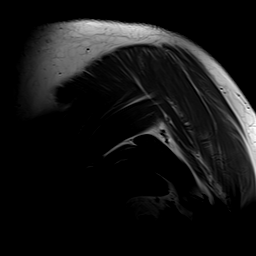
[im 6/21]
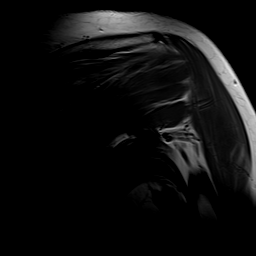
[im 9/21]
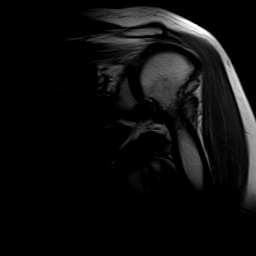
[im 12/21]
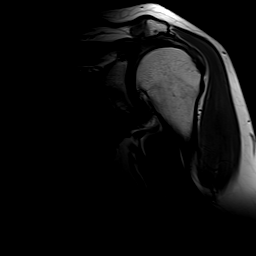
[im 15/21]
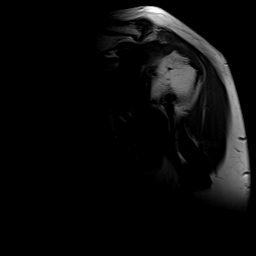
[im 18/21]
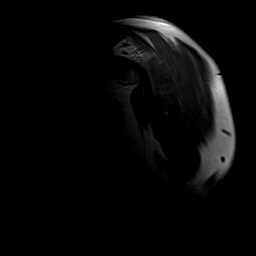
[im 21/21]
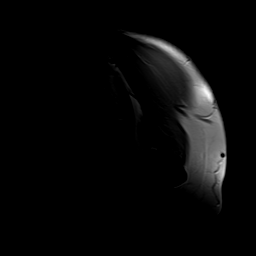

[Series 7: T1 · oblique · 4.0mm · 0.62mm/px · 8 of 23 slices shown]
[im 1/23]
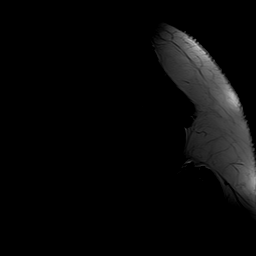
[im 4/23]
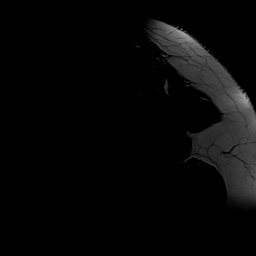
[im 7/23]
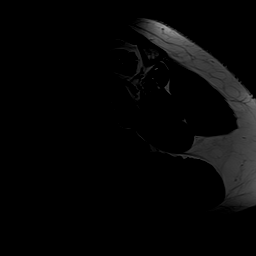
[im 10/23]
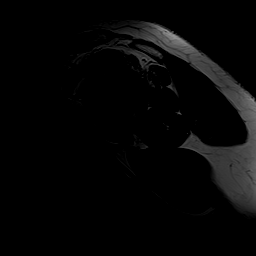
[im 13/23]
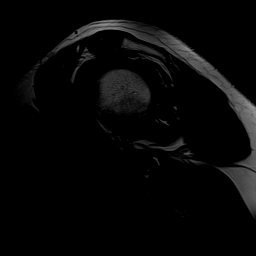
[im 16/23]
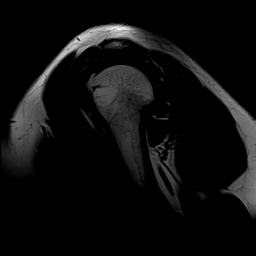
[im 19/23]
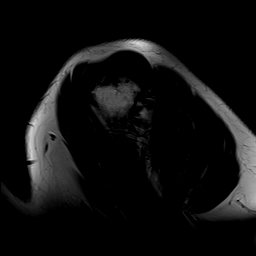
[im 23/23]
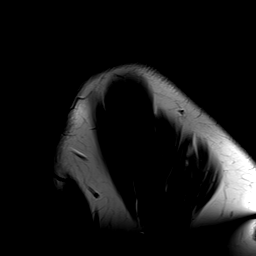

[Series 8: T2 fat-sat · oblique · 4.0mm · 0.62mm/px · 8 of 23 slices shown (3 of 3)]
[im 1/23]
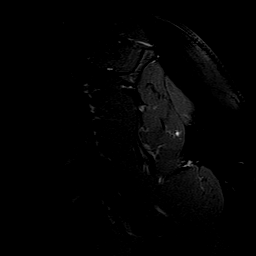
[im 4/23]
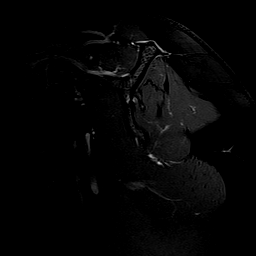
[im 7/23]
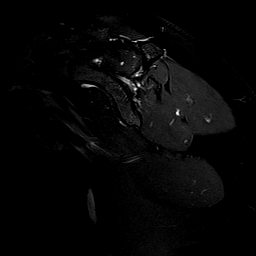
[im 10/23]
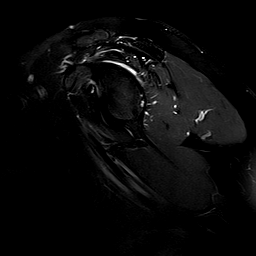
[im 13/23]
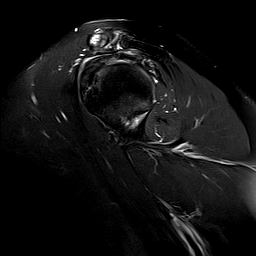
[im 16/23]
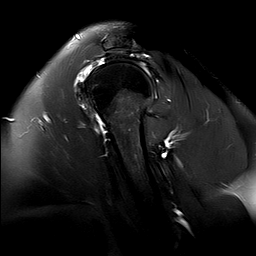
[im 19/23]
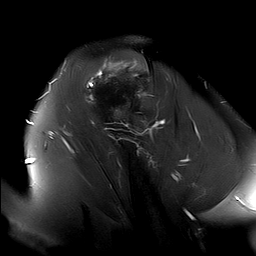
[im 23/23]
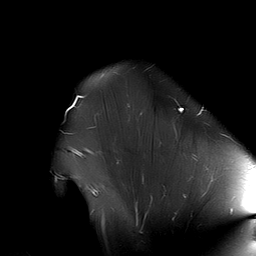

[40 of 40 positions shown; findings below may reference images not displayed]

FINDINGS: Rotator cuff: There is diffuse rotator cuff tendinosis. There is a
full-thickness insertional tear involving the anterior half of the
supraspinatus tendon, best demonstrated on the sagittal images. Some
of the tendon fibers are retracted up to 1.9 cm. Tear may extend
anteriorly into the rotator interval. There is tendinosis and
partial tearing of the infraspinatus and subscapularis tendons. The
teres minor tendon appears normal.

Muscles:  No focal muscular atrophy or edema.

Biceps long head:  Tendinosis of the intra-articular portion.

Acromioclavicular Joint: The acromion is type 1. There is an unfused
os acromiale. There are mild-to-moderate acromioclavicular
degenerative changes and a small amount of fluid in the subacromial
-subdeltoid bursa.

Glenohumeral Joint: No significant shoulder joint effusion. There
are mild glenohumeral degenerative changes.

Labrum: There is labral degeneration posteriorly with adjacent
subchondral cyst formation in the glenoid. No displaced labral tear
or paralabral cyst seen.

Bones: No acute or significant extra-articular osseous findings.

Other: No significant soft tissue findings.
IMPRESSION: 1. Moderate size full-thickness insertional tear involving the
anterior half of the supraspinatus tendon with mild tendon
retraction.
2. Underlying diffuse rotator cuff tendinosis with partial
intrasubstance tearing of the infraspinatus and subscapularis
tendons.
3. Underlying os acromiale may predispose to rotator cuff
impingement. There is a small amount of fluid in the subacromial
-subdeltoid bursa.
4. Tendinosis of the intra-articular portion of the biceps tendon.

## 2019-11-02 ENCOUNTER — Other Ambulatory Visit: Payer: Self-pay
